# Patient Record
Sex: Male | Born: 1965 | Race: Black or African American | Hispanic: No | Marital: Married | Smoking: Current every day smoker
Health system: Southern US, Community
[De-identification: ages and names within clinical notes are randomized; demographics above are authoritative.]

## PROBLEM LIST (undated history)

## (undated) HISTORY — PX: HERNIA REPAIR: SHX51

## (undated) NOTE — *Deleted (*Deleted)
   11/09/2019  CC: No chief complaint on file.   HPI: AMAURIS DEBOIS is a 80 y.o. male who returns for a surveillance cystoscopy.         There were no vitals taken for this visit. NED. A&Ox3.   No respiratory distress   Abd soft, NT, ND Normal phallus with bilateral descended testicles  Cystoscopy Procedure Note  Patient identification was confirmed, informed consent was obtained, and patient was prepped using Betadine solution.  Lidocaine jelly was administered per urethral meatus.     Pre-Procedure: - Inspection reveals a normal caliber ureteral meatus.  Procedure: The flexible cystoscope was introduced without difficulty - No urethral strictures/lesions are present. - {Blank multiple:19197::"Enlarged","Surgically absent","Normal"} prostate *** - {Blank multiple:19197::"Normal","Elevated","Tight"} bladder neck - Bilateral ureteral orifices identified - Bladder mucosa  reveals no ulcers, tumors, or lesions - No bladder stones - No trabeculation  Retroflexion shows ***   Post-Procedure: - Patient tolerated the procedure well  Assessment/ Plan:  1. Gross/microscopis hematuria/urethral lesion    No follow-ups on file.  Georges Mouse, am acting as a scribe for Dr. Vanna Scotland.  {Add Production assistant, radio Statement}

---

## 2006-06-05 ENCOUNTER — Emergency Department: Payer: Self-pay | Admitting: Emergency Medicine

## 2007-06-29 ENCOUNTER — Emergency Department: Payer: Self-pay | Admitting: Emergency Medicine

## 2012-03-02 ENCOUNTER — Ambulatory Visit: Payer: Self-pay | Admitting: Emergency Medicine

## 2014-05-19 NOTE — Op Note (Signed)
PATIENT NAME:  Zachary AshingSLADE, Treydon D MR#:  [id number] DATE OF BIRTH:  [birth year]  DATE OF PROCEDURE:  03/02/2012  PREOPERATIVE DIAGNOSIS: Incarcerated ventral hernia.   POSTOPERATIVE DIAGNOSIS:  Incarcerated ventral hernia.  PROCEDURE: Repair of incarcerated ventral hernia.   DESCRIPTION OF PROCEDURE: This patient, who had an incarcerated ventral hernia, was then brought to surgery. Under general anesthesia, the abdomen was then prepped and draped. A small incision was made supraumbilically.  After cutting skin and subcutaneous tissue, the hernia sac was isolated from, and it was not coming from the umbilicus, it was coming from above the umbilicus. It was dissected out completely. It had an incarcerated piece of omentum in it. The omentum was then suture ligated with hemostats, and then with 0 Vicryl, and then pushed back into the belly. The hole in the abdomen was small, so I did not put any mesh in it; so, I put a few stitches with interrupted 0 Surgilon sutures. Subcuticular suturing was done with 3-0 Vicryl and 4-0 Vicryl. The patient tolerated the procedure well and was sent to the recovery room in satisfactory condition.   ____________________________ Alton RevereMasud S. Cecelia ByarsHashmi, MD msh:cb D: 03/02/2012 12:08:52 ET T: 03/02/2012 13:30:46 ET JOB#: [id number]  cc: Jerimie Mancuso S. Cecelia ByarsHashmi, MD, <Dictator> Margaretann LovelessNeelam S. Khan, MD Meryle ReadyMASUD S Dovie Kapusta MD ELECTRONICALLY SIGNED 03/04/2012 10:17

## 2019-08-24 ENCOUNTER — Emergency Department: Payer: No Typology Code available for payment source

## 2019-08-24 ENCOUNTER — Other Ambulatory Visit: Payer: Self-pay

## 2019-08-24 ENCOUNTER — Inpatient Hospital Stay
Admission: EM | Admit: 2019-08-24 | Discharge: 2019-08-28 | DRG: 728 | Disposition: A | Discharge status: Home / Self Care | Payer: No Typology Code available for payment source | Attending: Internal Medicine | Admitting: Internal Medicine

## 2019-08-24 DIAGNOSIS — N5089 Other specified disorders of the male genital organs: Secondary | ICD-10-CM | POA: Diagnosis not present

## 2019-08-24 DIAGNOSIS — N453 Epididymo-orchitis: Secondary | ICD-10-CM | POA: Diagnosis present

## 2019-08-24 DIAGNOSIS — F17210 Nicotine dependence, cigarettes, uncomplicated: Secondary | ICD-10-CM | POA: Diagnosis present

## 2019-08-24 DIAGNOSIS — B9620 Unspecified Escherichia coli [E. coli] as the cause of diseases classified elsewhere: Secondary | ICD-10-CM | POA: Diagnosis present

## 2019-08-24 DIAGNOSIS — N492 Inflammatory disorders of scrotum: Secondary | ICD-10-CM | POA: Diagnosis not present

## 2019-08-24 DIAGNOSIS — D649 Anemia, unspecified: Secondary | ICD-10-CM | POA: Diagnosis present

## 2019-08-24 DIAGNOSIS — D72829 Elevated white blood cell count, unspecified: Secondary | ICD-10-CM | POA: Diagnosis present

## 2019-08-24 DIAGNOSIS — Z720 Tobacco use: Secondary | ICD-10-CM | POA: Diagnosis not present

## 2019-08-24 DIAGNOSIS — Z20822 Contact with and (suspected) exposure to covid-19: Secondary | ICD-10-CM | POA: Diagnosis present

## 2019-08-24 DIAGNOSIS — N410 Acute prostatitis: Secondary | ICD-10-CM

## 2019-08-24 DIAGNOSIS — N3001 Acute cystitis with hematuria: Secondary | ICD-10-CM | POA: Diagnosis present

## 2019-08-24 DIAGNOSIS — N5082 Scrotal pain: Secondary | ICD-10-CM

## 2019-08-24 DIAGNOSIS — D638 Anemia in other chronic diseases classified elsewhere: Secondary | ICD-10-CM | POA: Diagnosis present

## 2019-08-24 LAB — BASIC METABOLIC PANEL
Anion gap: 11 (ref 5–15)
BUN: 19 mg/dL (ref 6–20)
CO2: 28 mmol/L (ref 22–32)
Calcium: 8.8 mg/dL — ABNORMAL LOW (ref 8.9–10.3)
Chloride: 96 mmol/L — ABNORMAL LOW (ref 98–111)
Creatinine, Ser: 1.21 mg/dL (ref 0.61–1.24)
GFR calc Af Amer: >60 mL/min (ref >60)
GFR calc non Af Amer: >60 mL/min (ref >60)
Glucose, Bld: 117 mg/dL — ABNORMAL HIGH (ref 70–99)
Potassium: 4.5 mmol/L (ref 3.5–5.1)
Sodium: 135 mmol/L (ref 135–145)

## 2019-08-24 LAB — CBC
HCT: 37.1 % — ABNORMAL LOW (ref 39.0–52.0)
HCT: 32.8 % — ABNORMAL LOW (ref 39.0–52.0)
Hemoglobin: 11.9 g/dL — ABNORMAL LOW (ref 13.0–17.0)
Hemoglobin: 10.6 g/dL — ABNORMAL LOW (ref 13.0–17.0)
MCH: 29.7 pg (ref 26.0–34.0)
MCH: 30.2 pg (ref 26.0–34.0)
MCHC: 32.1 g/dL (ref 30.0–36.0)
MCHC: 32.3 g/dL (ref 30.0–36.0)
MCV: 92.5 fL (ref 80.0–100.0)
MCV: 93.4 fL (ref 80.0–100.0)
Platelets: 365 10*3/uL (ref 150–400)
Platelets: 342 10*3/uL (ref 150–400)
RBC: 4.01 MIL/uL — ABNORMAL LOW (ref 4.22–5.81)
RBC: 3.51 MIL/uL — ABNORMAL LOW (ref 4.22–5.81)
RDW: 12.7 % (ref 11.5–15.5)
RDW: 12.8 % (ref 11.5–15.5)
WBC: 41.6 10*3/uL — ABNORMAL HIGH (ref 4.0–10.5)
WBC: 36.1 10*3/uL — ABNORMAL HIGH (ref 4.0–10.5)
nRBC: 0 % (ref 0.0–0.2)
nRBC: 0 % (ref 0.0–0.2)

## 2019-08-24 LAB — CREATININE, SERUM
Creatinine, Ser: 1.14 mg/dL (ref 0.61–1.24)
GFR calc Af Amer: >60 mL/min (ref >60)
GFR calc non Af Amer: >60 mL/min (ref >60)

## 2019-08-24 LAB — LACTIC ACID, PLASMA: Lactic Acid, Venous: 1.3 mmol/L (ref 0.5–1.9)

## 2019-08-24 LAB — URINALYSIS, COMPLETE (UACMP) WITH MICROSCOPIC
Bilirubin Urine: NEGATIVE
Glucose, UA: NEGATIVE mg/dL
Ketones, ur: NEGATIVE mg/dL
Nitrite: NEGATIVE
Protein, ur: 100 mg/dL — AB
RBC / HPF: >50 RBC/hpf — ABNORMAL HIGH (ref 0–5)
Specific Gravity, Urine: 1.019 (ref 1.005–1.030)
WBC, UA: >50 WBC/hpf — ABNORMAL HIGH (ref 0–5)
pH: 5 (ref 5.0–8.0)

## 2019-08-24 LAB — SARS CORONAVIRUS 2 BY RT PCR (HOSPITAL ORDER, PERFORMED IN ~~LOC~~ HOSPITAL LAB): SARS Coronavirus 2: NEGATIVE

## 2019-08-24 MED ORDER — ONDANSETRON HCL 4 MG PO TABS: 4.0000 mg | ORAL_TABLET | Freq: Four times a day (QID) | ORAL | Status: DC | PRN

## 2019-08-24 MED ORDER — VANCOMYCIN HCL 1500 MG/300ML IV SOLN
1500.0000 mg | Freq: Once | INTRAVENOUS | Status: AC
  Administered 2019-08-24: 1500 mg via INTRAVENOUS
  Filled 2019-08-24: qty 300

## 2019-08-24 MED ORDER — SODIUM CHLORIDE 0.9 % IV SOLN
1.0000 g | Freq: Three times a day (TID) | INTRAVENOUS | Status: DC
  Administered 2019-08-24 – 2019-08-26 (×5): 1 g via INTRAVENOUS
  Filled 2019-08-24 (×7): qty 1

## 2019-08-24 MED ORDER — VANCOMYCIN HCL 1250 MG/250ML IV SOLN
1250.0000 mg | Freq: Two times a day (BID) | INTRAVENOUS | Status: DC
  Administered 2019-08-25 – 2019-08-26 (×3): 1250 mg via INTRAVENOUS
  Filled 2019-08-24 (×6): qty 250

## 2019-08-24 MED ORDER — SODIUM CHLORIDE 0.9 % IV SOLN: INTRAVENOUS | Status: DC

## 2019-08-24 MED ORDER — ENOXAPARIN SODIUM 40 MG/0.4ML ~~LOC~~ SOLN
40.0000 mg | SUBCUTANEOUS | Status: DC
  Administered 2019-08-24 – 2019-08-25 (×2): 40 mg via SUBCUTANEOUS
  Filled 2019-08-24 (×4): qty 0.4

## 2019-08-24 MED ORDER — VANCOMYCIN HCL 500 MG/100ML IV SOLN
500.0000 mg | Freq: Once | INTRAVENOUS | Status: AC
  Administered 2019-08-24: 500 mg via INTRAVENOUS
  Filled 2019-08-24: qty 100

## 2019-08-24 MED ORDER — ONDANSETRON HCL 4 MG/2ML IJ SOLN: 4.0000 mg | Freq: Four times a day (QID) | INTRAMUSCULAR | Status: DC | PRN

## 2019-08-24 MED ORDER — LACTATED RINGERS IV BOLUS
1000.0000 mL | Freq: Once | INTRAVENOUS | Status: AC
  Administered 2019-08-24: 1000 mL via INTRAVENOUS

## 2019-08-24 MED ORDER — MORPHINE SULFATE (PF) 2 MG/ML IV SOLN: 2.0000 mg | INTRAVENOUS | Status: DC | PRN

## 2019-08-24 MED ORDER — METRONIDAZOLE IN NACL 5-0.79 MG/ML-% IV SOLN
500.0000 mg | Freq: Once | INTRAVENOUS | Status: AC
  Administered 2019-08-24: 500 mg via INTRAVENOUS
  Filled 2019-08-24: qty 100

## 2019-08-24 MED ORDER — SODIUM CHLORIDE 0.9 % IV SOLN
2.0000 g | Freq: Once | INTRAVENOUS | Status: AC
  Administered 2019-08-24: 2 g via INTRAVENOUS
  Filled 2019-08-24: qty 2

## 2019-08-24 MED ORDER — IOHEXOL 300 MG/ML  SOLN
100.0000 mL | Freq: Once | INTRAMUSCULAR | Status: AC | PRN
  Administered 2019-08-24: 100 mL via INTRAVENOUS

## 2019-08-24 NOTE — ED Notes (Signed)
This RN messaged pharmacy for med verification.  

## 2019-08-24 NOTE — ED Notes (Signed)
This [facility] pharmacy requesting meds.

## 2019-08-24 NOTE — Progress Notes (Signed)
Pharmacy Antibiotic Note  Zachary Joseph is a 54 y.o. male admitted on 08/24/2019 with sepsis s/t scrotal cellulitis.  Pharmacy has been consulted for vancomycin dosing. Patient received vanc 2g IV load in ED.  Plan: Will continue vancomycin 1.25g IV q12h and continue to monitor renal function and make adjustments based on changes in renal function.   Ke 0.0652 T1/2 ~ 12 hrs  Height: 5\' 9"  (175.3 cm) Weight: 81.6 kg (180 lb) IBW/kg (Calculated) : 70.7  Temp (24hrs), Avg:98.9 F (37.2 C), Min:98.4 F (36.9 C), Max:99.8 F (37.7 C)  Recent Labs  Lab 08/24/19 1242 08/24/19 2010 08/24/19 2204  WBC 41.6*  --  36.1*  CREATININE 1.21  --  1.14  LATICACIDVEN  --  1.3  --     Estimated Creatinine Clearance: 73.2 mL/min (by C-G formula based on SCr of 1.14 mg/dL).    No Known Allergies  Thank you for allowing pharmacy to be a part of this patient's care.  [id number], PharmD, BCPS Clinical Pharmacist 08/24/2019 11:16 PM

## 2019-08-24 NOTE — ED Triage Notes (Signed)
Pt c/o having painful urination for a day and then started having BL testicle swelling with pain over the past 2 days with fever and sweats. Denies any redness of the skin. Denies any painful urination at present.

## 2019-08-24 NOTE — ED Notes (Signed)
This RN called lab to place an add-on for a urine culture.

## 2019-08-24 NOTE — ED Notes (Signed)
Pt to Ct

## 2019-08-24 NOTE — Consult Note (Signed)
Urology Consult  I have been asked to see the patient by Dr.Smith , for evaluation and management of scrotal swelling.  Chief Complaint: scrotal pain  History of Present Illness: Zachary Joseph is a 54 y.o. year old no previous medical history (does not see MDs) who presents to the emergency room with 2 days of progressive scrotal swelling and pain.  He notes that about a week ago, he began having gross hematuria and some urinary symptoms including mild dysuria and sweats.  2 days ago, he began having increasing scrotal pain and swelling.  He got to the point where he is having difficulty ambulating due to the tenderness.  He also mentions that he is passed some debris about a week ago which he felt may have been a stone.  He denies any flank pain.  Subjective fevers only.  No personal history of UTIs, urinary symptoms, or scrotal infections.  As far as he is aware, he is not a diabetic.  History reviewed. No pertinent past medical history.  Past Surgical History:  Procedure Laterality Date  . HERNIA REPAIR      Home Medications:  Takes no meds  Allergies: No Known Allergies  No family history on file.  Social History:  reports that he has been smoking cigarettes. He has never used smokeless tobacco. He reports previous alcohol use. He reports previous drug use.  ROS: A complete review of systems was performed.  All systems are negative except for pertinent findings as noted.  Physical Exam:  Vital signs in last 24 hours: Temp:  [98.5 F (36.9 C)-99.8 F (37.7 C)] 99.8 F (37.7 C) (07/28 2034) Pulse Rate:  [91] 91 (07/28 2034) Resp:  [17-18] 18 (07/28 2034) BP: (111-140)/(55-78) 140/78 (07/28 2034) SpO2:  [93 %-94 %] 93 % (07/28 2034) Weight:  [81.6 kg] 81.6 kg (07/28 1237) Constitutional:  Alert and oriented, No acute distress.  Pleasant.  Wife at bedside. HEENT: [place] AT, moist mucus membranes.  Trachea midline, no masses Cardiovascular: Regular rate and rhythm, no  clubbing, cyanosis, or edema. Respiratory: Normal respiratory effort, lungs clear bilaterally GI: Abdomen is soft, nontender, nondistended, no abdominal masses GU: Uncircumcised phallus with easily retractable foreskin, no penile discharge.  Bilateral descended testicles with slightly erythematous but nonindurated nonedematous skin.  Bilateral testicles are diffusely enlarged, right greater than left, firm, and tender consistent with severe bilateral epididymoorchitis.  Perineum is intact.  No crepitus, fluctuance, or any other physical exam findings consistent or concerning for Fournier's gangrene. Skin: No rashes, bruises or suspicious lesions Neurologic: Grossly intact, no focal deficits, moving all 4 extremities Psychiatric: Normal mood and affect   Laboratory Data:  Recent Labs    08/24/19 1242  WBC 41.6*  HGB 11.9*  HCT 37.1*   Recent Labs    08/24/19 1242  NA 135  K 4.5  CL 96*  CO2 28  GLUCOSE 117*  BUN 19  CREATININE 1.21  CALCIUM 8.8*   Urinalysis reviewed, grossly positive.  Radiologic Imaging: CLINICAL DATA:  Bilateral testicular pain and swelling and painful urination  EXAM: CT ABDOMEN AND PELVIS WITH CONTRAST  TECHNIQUE: Multidetector CT imaging of the abdomen and pelvis was performed using the standard protocol following bolus administration of intravenous contrast.  CONTRAST:  OMNIPAQUE IOHEXOL 300 MG/ML  SOLN  COMPARISON:  None.  FINDINGS: Lower chest: The visualized heart size within normal limits. No pericardial fluid/thickening.  No hiatal hernia.  The visualized portions of the lungs are clear.  Hepatobiliary:  The liver is normal in density without focal abnormality.The main portal vein is patent. No evidence of calcified gallstones, gallbladder wall thickening or biliary dilatation.  Pancreas: Unremarkable. No pancreatic ductal dilatation or surrounding inflammatory changes.  Spleen: Normal in size without focal  abnormality.  Adrenals/Urinary Tract: Both adrenal glands appear normal. Tiny bilateral hypodense lesions are seen within both kidneys, likely statistically renal cysts. No renal or collecting system calculi. The bladder is partially decompressed with apparent mild diffuse wall thickening and hyperenhancement.  Stomach/Bowel: The stomach, small bowel, and colon are normal in appearance. Scattered colonic diverticula are noted. No inflammatory changes, wall thickening, or obstructive findings.The appendix is normal.  Vascular/Lymphatic: There are no enlarged mesenteric, retroperitoneal, or pelvic lymph nodes. Scattered calcified and noncalcified plaque is noted.  Reproductive: The prostate is unremarkable.  Other: There is diffuse subcutaneous edema seen surrounding the bilateral testicles with small bilateral hydroceles. There is mild edema and wall thickening noted within the the perineal soft tissues. No subcutaneous emphysema is seen.  Musculoskeletal: No acute or significant osseous findings.  IMPRESSION: Findings which could be suggestive of diffuse cellulitis involving the bilateral scrotum with small bilateral hydroceles. No evidence of subcutaneous emphysema.  Partially decompressed bladder with findings that could be suggestive mild cystitis.  Aortic Atherosclerosis (ICD10-I70.0).   Electronically Signed   By: Jonna Clark M.D.   On: 08/24/2019 21:05  CT scan was personally reviewed.  Agree with radiologic interpretation.  There is no evidence of Fournier's gangrene on this study.  No drainable fluid collections.  Impression/Assessment:  54 year old male with acute cystitis with hematuria complicated by severe bilateral epididymoorchitis.  At this point time, there is no concern for Fournier's gangrene.  Significant leukocytosis but otherwise hemodynamically stable without other signs of systemic disease.  Plan:  -Given initial concern for more  aggressive disease, Urology will continue to follow this patient closely.  Like him to be n.p.o. at midnight for early morning evaluation.  If his exam is stable or improving, will advance diet. -Recommend broad-spectrum antibiotics including anaerobic coverage for the time being, just based on culture and sensitivity data -Agree with admission to medicine, urology will follow along  08/24/2019, 9:04 PM  Vanna Scotland,  MD

## 2019-08-24 NOTE — ED Notes (Signed)
Pt provided with urinal at this time

## 2019-08-24 NOTE — ED Provider Notes (Signed)
[facility] Emergency Department Provider Note ____________________________________________   First MD Initiated Contact with Patient 08/24/19 1958     (approximate)  I have reviewed the triage vital signs and the nursing notes.  HISTORY  Chief Complaint Groin Swelling   HPI Zachary Joseph is a 54 y.o. male presents to the ED for evaluation of dysuria and testicular swelling.    Patient reports a 40-pack-year smoking history, otherwise denies any medical history.  Denies taking prescription medications, or previously being prescribed medications.  Reports not liking hospitals or physicians. Report surgical history of previous umbilical hernia repair.  Patient reports 3-4 days of dysuria at home, and noticing rapidly progressive groin swelling when he woke up this morning.  Patient denies any fevers but his wife, who is present in the room, reports patient has been having night sweats and this is similar timeframe.  Patient reports difficulty walking due to his testicular pain, citing 9/10 intensity pain with ambulation.  Reports the pain eases up when he sits down, 1-4/10 intensity pain.  Patient has not had recent antibiotics, and denies urologic surgery in the past. Patient denies diarrhea or acute stool changes.  Denies hematuria or trauma.  History reviewed. No pertinent past medical history.  There are no problems to display for this patient.   Past Surgical History:  Procedure Laterality Date  . HERNIA REPAIR      Prior to Admission medications   Not on File    Allergies Patient has no known allergies.  No family history on file.  Social History Social History   Tobacco Use  . Smoking status: Current Every Day Smoker    Types: Cigarettes  . Smokeless tobacco: Never Used  Substance Use Topics  . Alcohol use: Not Currently  . Drug use: Not Currently    Review of Systems  Constitutional: No fever/chills Eyes: No visual  changes. ENT: No sore throat. Cardiovascular: Denies chest pain. Respiratory: Denies shortness of breath. Gastrointestinal: No abdominal pain.  No nausea, no vomiting.  No diarrhea.  No constipation. Genitourinary: Positive for dysuria and scrotal swelling. Musculoskeletal: Negative for back pain. Skin: Negative for rash. Neurological: Negative for headaches, focal weakness or numbness.  ____________________________________________   PHYSICAL EXAM:  VITAL SIGNS: ED Triage Vitals  Enc Vitals Group     BP 08/24/19 1237 (!) 111/55     Pulse Rate 08/24/19 1237 91     Resp 08/24/19 1237 17     Temp 08/24/19 1242 98.5 F (36.9 C)     Temp Source 08/24/19 1237 Oral     SpO2 08/24/19 1237 94 %     Weight 08/24/19 1237 180 lb (81.6 kg)     Height 08/24/19 1237 5\' 9"  (1.753 m)     Head Circumference --      Peak Flow --      Pain Score 08/24/19 1237 10     Pain Loc --      Pain Edu? --      Excl. in GC? --      Constitutional: Alert and oriented.  Supine in bed, clearly uncomfortable but without acute distress.  Conversational in full sentences. Eyes: Conjunctivae are normal. PERRL. EOMI. Head: Atraumatic. Nose: No congestion/rhinnorhea. Mouth/Throat: Mucous membranes are moist.  Oropharynx non-erythematous. Neck: No stridor. No cervical spine tenderness to palpation. Cardiovascular: Normal rate, regular rhythm. Grossly normal heart sounds.  Good peripheral circulation. Respiratory: Normal respiratory effort.  No retractions. Lungs CTAB. Gastrointestinal: Soft , nondistended, nontender to  palpation. No abdominal bruits. No CVA tenderness. GU: Uncircumcised penis without swelling, lesions or tenderness to palpation.  No urethral discharge. Obviously and diffusely swollen scrotum L > R sides.  Left greater than right testicular tenderness to palpation.  Bilateral testicles appear diffusely enlarged without nodularity.  No palpable additional masses to suggest hernia.  When I lift  his scrotum to visualize the posterior aspect, he exclaims in pain. Musculoskeletal: No lower extremity tenderness nor edema.  No joint effusions. No signs of acute trauma. Neurologic:  Normal speech and language. No gross focal neurologic deficits are appreciated. No gait instability noted. Skin:  Skin is warm, dry and intact. No rash noted. Psychiatric: Mood and affect are normal. Speech and behavior are normal.  ____________________________________________   LABS (all labs ordered are listed, but only abnormal results are displayed)  Labs Reviewed  URINALYSIS, COMPLETE (UACMP) WITH MICROSCOPIC - Abnormal; Notable for the following components:      Result Value   Color, Urine AMBER (*)    APPearance CLOUDY (*)    Hgb urine dipstick LARGE (*)    Protein, ur 100 (*)    Leukocytes,Ua LARGE (*)    RBC / HPF >50 (*)    WBC, UA >50 (*)    Bacteria, UA FEW (*)    All other components within normal limits  BASIC METABOLIC PANEL - Abnormal; Notable for the following components:   Chloride 96 (*)    Glucose, Bld 117 (*)    Calcium 8.8 (*)    All other components within normal limits  CBC - Abnormal; Notable for the following components:   WBC 41.6 (*)    RBC 4.01 (*)    Hemoglobin 11.9 (*)    HCT 37.1 (*)    All other components within normal limits  CULTURE, BLOOD (ROUTINE X 2)  CULTURE, BLOOD (ROUTINE X 2)  URINE CULTURE  SARS CORONAVIRUS 2 BY RT PCR (HOSPITAL ORDER, PERFORMED IN [facility])  LACTIC ACID, PLASMA   ____________________________________________  12 Lead EKG   ____________________________________________  RADIOLOGY  ED MD interpretation:    Official radiology report(s): CT ABDOMEN PELVIS W CONTRAST  Result Date: 08/24/2019 CLINICAL DATA:  Bilateral testicular pain and swelling and painful urination EXAM: CT ABDOMEN AND PELVIS WITH CONTRAST TECHNIQUE: Multidetector CT imaging of the abdomen and pelvis was performed using the standard protocol  following bolus administration of intravenous contrast. CONTRAST:  OMNIPAQUE IOHEXOL 300 MG/ML  SOLN COMPARISON:  None. FINDINGS: Lower chest: The visualized heart size within normal limits. No pericardial fluid/thickening. No hiatal hernia. The visualized portions of the lungs are clear. Hepatobiliary: The liver is normal in density without focal abnormality.The main portal vein is patent. No evidence of calcified gallstones, gallbladder wall thickening or biliary dilatation. Pancreas: Unremarkable. No pancreatic ductal dilatation or surrounding inflammatory changes. Spleen: Normal in size without focal abnormality. Adrenals/Urinary Tract: Both adrenal glands appear normal. Tiny bilateral hypodense lesions are seen within both kidneys, likely statistically renal cysts. No renal or collecting system calculi. The bladder is partially decompressed with apparent mild diffuse wall thickening and hyperenhancement. Stomach/Bowel: The stomach, small bowel, and colon are normal in appearance. Scattered colonic diverticula are noted. No inflammatory changes, wall thickening, or obstructive findings.The appendix is normal. Vascular/Lymphatic: There are no enlarged mesenteric, retroperitoneal, or pelvic lymph nodes. Scattered calcified and noncalcified plaque is noted. Reproductive: The prostate is unremarkable. Other: There is diffuse subcutaneous edema seen surrounding the bilateral testicles with small bilateral hydroceles. There is mild edema  and wall thickening noted within the the perineal soft tissues. No subcutaneous emphysema is seen. Musculoskeletal: No acute or significant osseous findings. IMPRESSION: Findings which could be suggestive of diffuse cellulitis involving the bilateral scrotum with small bilateral hydroceles. No evidence of subcutaneous emphysema. Partially decompressed bladder with findings that could be suggestive mild cystitis. Aortic Atherosclerosis (ICD10-I70.0). Electronically Signed   By:  Jonna Clark M.D.   On: 08/24/2019 21:05    ____________________________________________   PROCEDURES and INTERVENTIONS  Procedure(s) performed (including Critical Care):  Procedures  Medications  vancomycin (VANCOREADY) IVPB 1500 mg/300 mL (has no administration in time range)  ceFEPIme (MAXIPIME) 2 g in sodium chloride 0.9 % 100 mL IVPB (2 g Intravenous New Bag/Given 08/24/19 2056)  metroNIDAZOLE (FLAGYL) IVPB 500 mg (has no administration in time range)  lactated ringers bolus 1,000 mL (1,000 mLs Intravenous New Bag/Given 08/24/19 2033)  iohexol (OMNIPAQUE) 300 MG/ML solution 100 mL (100 mLs Intravenous Contrast Given 08/24/19 2045)    ____________________________________________   INITIAL IMPRESSION / ASSESSMENT AND PLAN / ED COURSE  54 year old man with a smoking history presenting with scrotal pain and swelling concerning for scrotal cellulitis and possible prostatitis, requiring medical admission for IV antibiotics.  Hemodynamically stable with normal vital signs on room air.  Exam with diffusely swollen and tender scrotum without evidence of necrosis or penile lesions.  He has a benign abdomen, otherwise is without evidence of acute pathology.  Blood work with leukocytosis of 41, but no lactic acidosis.  Initial concern for Fournier's gangrene, and so consulted urology requests CT imaging, which does not show subcutaneous gas or evidence of gangrene at this time.  Provided broad-spectrum coverage of IV antibiotics, and will admit the patient to hospitalist medicine for further work-up and management.  Clinical Course as of Aug 24 2111  Wed Aug 24, 2019  2026 Called Urologist on call, Dr. Vanna Scotland. Straight to voicemail, left message   [DS]  2032 Callback from Dr. Apolinar Junes, who recommends CT imaging and return call to her.   [DS]  2055 Message back from Dr. Apolinar Junes on a secure chat function of epic, she has reviewed patient CT scan and recommends hospitalist admission for IV  antibiotics.  N.p.o. at midnight for possible procedure tomorrow if his clinical status does not improve.  Recommend additional coverage for anaerobes, so I added Flagyl. Unfortunately, it appears as though the CT scan was not extended inferiorly enough to include the inferior portion of his scrotum, and is therefore not a complete scan.   [DS]    Clinical Course User Index [DS] Delton Prairie, MD    ____________________________________________   FINAL CLINICAL IMPRESSION(S) / ED DIAGNOSES  Final diagnoses:  Cellulitis of scrotum  Scrotal pain  Acute prostatitis     ED Discharge Orders    None       Lamere Lightner Katrinka Blazing   Note:  This document was prepared using Dragon voice recognition software and may include unintentional dictation errors.   Delton Prairie, MD 08/24/19 2113

## 2019-08-24 NOTE — H&P (Signed)
History and Physical   Zachary Joseph WUJ:[id number] DOB: 1965/05/26 DOA: 08/24/2019  Referring MD/NP/PA: Dr. Katrinka BlazingSmith  PCP: Patient, No Pcp Per   Outpatient Specialists: None  Patient coming from: Home  Chief Complaint: Scrotal swelling   HPI: Zachary Joseph is a 54 y.o. male with medical history significant of no significant past medical history who also has not been following up with a position on a regular basis last physician visit more than 2 years ago married and sexually active with one partner who started having scrotal swelling about 2 days ago and since then this morning it has turned warm red and more painful.  Pain is a 10 out of 10.  More on the left scrotum than the right.  Associated with some mild dysuria.  No fever or chills.  Patient has no diarrhea no nausea vomiting.  He was seen in the ER and diagnosed with possible scrotal cellulitis.  Urology consulted with recommendation for admission and IV antibiotics.  Patient to be seen by urology and to be kept n.p.o. after midnight.  He still complaining of pain otherwise no other complaints.  Patient said he was unable to walk at home due to the swelling.  Any slight movement brings in the pain.  His scrotum is overall inflamed but more so the testicles.  He has been admitted for further work-up and treatment.  ED Course: Temperature 99.8, blood pressure 140/78 pulse 91 respiratory rate of 18 oxygen sat 93% on room air.  White count is 36.1 thousand hemoglobin 10.6 and platelet 342.  Chemistry largely within normal.  COVID-19 negative.  CT abdomen pelvis shows possible diffuse cellulitis involving the bilateral scrotum with small bilateral hydroceles no evidence of subcutaneous emphysema.  Probably also mild cystitis.  Patient initiated on IV antibiotics and being admitted for further work-up.  Review of Systems: As per HPI otherwise 10 point review of systems negative.    History reviewed. No pertinent past medical history.  Past  Surgical History:  Procedure Laterality Date   HERNIA REPAIR       reports that he has been smoking cigarettes. He has never used smokeless tobacco. He reports previous alcohol use. He reports previous drug use.  No Known Allergies  No family history on file.   Prior to Admission medications   Not on File    Physical Exam: Vitals:   08/24/19 1237 08/24/19 1242 08/24/19 2034 08/24/19 2134  BP: (!) 111/55  (!) 140/78   Pulse: 91  91   Resp: 17  18   Temp:  98.5 F (36.9 C) 99.8 F (37.7 C) 98.4 F (36.9 C)  TempSrc: Oral  Oral Oral  SpO2: 94%  93%   Weight: 81.6 kg     Height: 5\' 9"  (1.753 m)         Constitutional: Acutely ill looking no acute distress Vitals:   08/24/19 1237 08/24/19 1242 08/24/19 2034 08/24/19 2134  BP: (!) 111/55  (!) 140/78   Pulse: 91  91   Resp: 17  18   Temp:  98.5 F (36.9 C) 99.8 F (37.7 C) 98.4 F (36.9 C)  TempSrc: Oral  Oral Oral  SpO2: 94%  93%   Weight: 81.6 kg     Height: 5\' 9"  (1.753 m)      Eyes: PERRL, lids and conjunctivae normal ENMT: Mucous membranes are dry. Posterior pharynx clear of any exudate or lesions.Normal dentition.  Neck: normal, supple, no masses, no thyromegaly Respiratory: clear to auscultation  bilaterally, no wheezing, no crackles. Normal respiratory effort. No accessory muscle use.  Cardiovascular: Regular rate and rhythm, no murmurs / rubs / gallops. No extremity edema. 2+ pedal pulses. No carotid bruits.  Abdomen: no tenderness, no masses palpated. No hepatosplenomegaly. Bowel sounds positive.  Musculoskeletal: no clubbing / cyanosis. No joint deformity upper and lower extremities. Good ROM, no contractures. Normal muscle tone.  Skin: no rashes, lesions, ulcers. No induration GU: Patient's scrotum is swollen, tender, testicles being especially on the left, warm to touch surrounding erythema Neurologic: CN 2-12 grossly intact. Sensation intact, DTR normal. Strength 5/5 in all 4.  Psychiatric: Normal  judgment and insight. Alert and oriented x 3. Normal mood.     Labs on Admission: I have personally reviewed following labs and imaging studies  CBC: Recent Labs  Lab 08/24/19 1242 08/24/19 2204  WBC 41.6* 36.1*  HGB 11.9* 10.6*  HCT 37.1* 32.8*  MCV 92.5 93.4  PLT 365 342   Basic Metabolic Panel: Recent Labs  Lab 08/24/19 1242 08/24/19 2204  NA 135  --   K 4.5  --   CL 96*  --   CO2 28  --   GLUCOSE 117*  --   BUN 19  --   CREATININE 1.21 1.14  CALCIUM 8.8*  --    GFR: Estimated Creatinine Clearance: 73.2 mL/min (by C-G formula based on SCr of 1.14 mg/dL). Liver Function Tests: No results for input(s): AST, ALT, ALKPHOS, BILITOT, PROT, ALBUMIN in the last 168 hours. No results for input(s): LIPASE, AMYLASE in the last 168 hours. No results for input(s): AMMONIA in the last 168 hours. Coagulation Profile: No results for input(s): INR, PROTIME in the last 168 hours. Cardiac Enzymes: No results for input(s): CKTOTAL, CKMB, CKMBINDEX, TROPONINI in the last 168 hours. BNP (last 3 results) No results for input(s): PROBNP in the last 8760 hours. HbA1C: No results for input(s): HGBA1C in the last 72 hours. CBG: No results for input(s): GLUCAP in the last 168 hours. Lipid Profile: No results for input(s): CHOL, HDL, LDLCALC, TRIG, CHOLHDL, LDLDIRECT in the last 72 hours. Thyroid Function Tests: No results for input(s): TSH, T4TOTAL, FREET4, T3FREE, THYROIDAB in the last 72 hours. Anemia Panel: No results for input(s): VITAMINB12, FOLATE, FERRITIN, TIBC, IRON, RETICCTPCT in the last 72 hours. Urine analysis:    Component Value Date/Time   COLORURINE AMBER (A) 08/24/2019 1242   APPEARANCEUR CLOUDY (A) 08/24/2019 1242   LABSPEC 1.019 08/24/2019 1242   PHURINE 5.0 08/24/2019 1242   GLUCOSEU NEGATIVE 08/24/2019 1242   HGBUR LARGE (A) 08/24/2019 1242   BILIRUBINUR NEGATIVE 08/24/2019 1242   KETONESUR NEGATIVE 08/24/2019 1242   PROTEINUR 100 (A) 08/24/2019 1242    NITRITE NEGATIVE 08/24/2019 1242   LEUKOCYTESUR LARGE (A) 08/24/2019 1242   Sepsis Labs: @LABRCNTIP (procalcitonin:4,lacticidven:4) ) Recent Results (from the past 240 hour(s))  SARS Coronavirus 2 by RT PCR (hospital order, performed in [facility] hospital lab) Nasopharyngeal Nasopharyngeal Swab     Status: None   Collection Time: 08/24/19  9:22 PM   Specimen: Nasopharyngeal Swab  Result Value Ref Range Status   SARS Coronavirus 2 NEGATIVE NEGATIVE Final    Comment: (NOTE) SARS-CoV-2 target nucleic acids are NOT DETECTED.  The SARS-CoV-2 RNA is generally detectable in upper and lower respiratory specimens during the acute phase of infection. The lowest concentration of SARS-CoV-2 viral copies this assay can detect is 250 copies / mL. A negative result does not preclude SARS-CoV-2 infection and should not be used as the  sole basis for treatment or other patient management decisions.  A negative result may occur with improper specimen collection / handling, submission of specimen other than nasopharyngeal swab, presence of viral mutation(s) within the areas targeted by this assay, and inadequate number of viral copies (<250 copies / mL). A negative result must be combined with clinical observations, patient history, and epidemiological information.  Fact Sheet for Patients:   BoilerBrush.com.cy  Fact Sheet for Healthcare Providers: https://pope.com/  This test is not yet approved or  cleared by the [place] FDA and has been authorized for detection and/or diagnosis of SARS-CoV-2 by FDA under an Emergency Use Authorization (EUA).  This EUA will remain in effect (meaning this test can be used) for the duration of the COVID-19 declaration under Section 564(b)(1) of the Act, 21 U.S.C. section 360bbb-3(b)(1), unless the authorization is terminated or revoked sooner.  Performed at [facility], [address]  [place], [place] [address]      Radiological Exams on Admission: CT ABDOMEN PELVIS W CONTRAST  Result Date: 08/24/2019 CLINICAL DATA:  Bilateral testicular pain and swelling and painful urination EXAM: CT ABDOMEN AND PELVIS WITH CONTRAST TECHNIQUE: Multidetector CT imaging of the abdomen and pelvis was performed using the standard protocol following bolus administration of intravenous contrast. CONTRAST:  OMNIPAQUE IOHEXOL 300 MG/ML  SOLN COMPARISON:  None. FINDINGS: Lower chest: The visualized heart size within normal limits. No pericardial fluid/thickening. No hiatal hernia. The visualized portions of the lungs are clear. Hepatobiliary: The liver is normal in density without focal abnormality.The main portal vein is patent. No evidence of calcified gallstones, gallbladder wall thickening or biliary dilatation. Pancreas: Unremarkable. No pancreatic ductal dilatation or surrounding inflammatory changes. Spleen: Normal in size without focal abnormality. Adrenals/Urinary Tract: Both adrenal glands appear normal. Tiny bilateral hypodense lesions are seen within both kidneys, likely statistically renal cysts. No renal or collecting system calculi. The bladder is partially decompressed with apparent mild diffuse wall thickening and hyperenhancement. Stomach/Bowel: The stomach, small bowel, and colon are normal in appearance. Scattered colonic diverticula are noted. No inflammatory changes, wall thickening, or obstructive findings.The appendix is normal. Vascular/Lymphatic: There are no enlarged mesenteric, retroperitoneal, or pelvic lymph nodes. Scattered calcified and noncalcified plaque is noted. Reproductive: The prostate is unremarkable. Other: There is diffuse subcutaneous edema seen surrounding the bilateral testicles with small bilateral hydroceles. There is mild edema and wall thickening noted within the the perineal soft tissues. No subcutaneous emphysema is seen. Musculoskeletal: No acute or significant  osseous findings. IMPRESSION: Findings which could be suggestive of diffuse cellulitis involving the bilateral scrotum with small bilateral hydroceles. No evidence of subcutaneous emphysema. Partially decompressed bladder with findings that could be suggestive mild cystitis. Aortic Atherosclerosis (ICD10-I70.0). Electronically Signed   By: Jonna Clark M.D.   On: 08/24/2019 21:05      Assessment/Plan Principal Problem:   Cellulitis of scrotum Active Problems:   Orchitis and epididymitis     #1 suspected cellulitis of the scrotum: Patient most likely also has orchitis involving the left scrotum.  Also suspected hydrocele.  Patient will be admitted to the medical floor.  Elevate the scrotum.  Pain control.  IV antibiotics to cover for gram positives, gram negatives and anaerobes.  Urology already consulted.  We will follow recommendations.  We will check scrotal ultrasound to further characterize lesion.  Urine culture has been sent and will follow.  #2 mild cystitis: Again await urine culture sensitivities.  Empirically on antibiotics.  Will follow  #3 leukocytosis: Significant  leukocytosis most likely from his inflammation.  White count of 36,000.  Follow closely.   #4 normocytic anemia: No prior physician follow-up.  No colonoscopies.  Check stool guaiacs but patient likely needs to have colonoscopy scheduled at discharge.  Tobacco abuse: Tobacco cessation counseling with nicotine patch to be offered   DVT prophylaxis: Lovenox Code Status: Full code Family Communication: Wife at bedside Disposition Plan: Home Consults called: Urology Dr. Apolinar Junes Admission status: Inpatient  Severity of Illness: The appropriate patient status for this patient is INPATIENT. Inpatient status is judged to be reasonable and necessary in order to provide the required intensity of service to ensure the patient's safety. The patient's presenting symptoms, physical exam findings, and initial radiographic and  laboratory data in the context of their chronic comorbidities is felt to place them at high risk for further clinical deterioration. Furthermore, it is not anticipated that the patient will be medically stable for discharge from the hospital within 2 midnights of admission. The following factors support the patient status of inpatient.   " The patient's presenting symptoms include scrotal swelling. " The worrisome physical exam findings include tender swollen scrotum. " The initial radiographic and laboratory data are worrisome because of possible cellulitis with cystitis on CT. " The chronic co-morbidities include none.   * I certify that at the point of admission it is my clinical judgment that the patient will require inpatient hospital care spanning beyond 2 midnights from the point of admission due to high intensity of service, high risk for further deterioration and high frequency of surveillance required.Lonia Blood MD [facility] Pager [phone]  If 7PM-7AM, please contact night-coverage [email or web address] Password [facility]  08/24/2019, 11:22 PM

## 2019-08-24 NOTE — ED Notes (Signed)
ED Provider Smith at bedside. 

## 2019-08-24 NOTE — ED Notes (Signed)
MD Garba at bedside at this time.

## 2019-08-25 ENCOUNTER — Inpatient Hospital Stay: Payer: No Typology Code available for payment source

## 2019-08-25 DIAGNOSIS — N453 Epididymo-orchitis: Secondary | ICD-10-CM

## 2019-08-25 DIAGNOSIS — D72829 Elevated white blood cell count, unspecified: Secondary | ICD-10-CM

## 2019-08-25 DIAGNOSIS — Z720 Tobacco use: Secondary | ICD-10-CM

## 2019-08-25 LAB — COMPREHENSIVE METABOLIC PANEL
ALT: 28 U/L (ref 0–44)
AST: 36 U/L (ref 15–41)
Albumin: 2.6 g/dL — ABNORMAL LOW (ref 3.5–5.0)
Alkaline Phosphatase: 78 U/L (ref 38–126)
Anion gap: 10 (ref 5–15)
BUN: 20 mg/dL (ref 6–20)
CO2: 30 mmol/L (ref 22–32)
Calcium: 8 mg/dL — ABNORMAL LOW (ref 8.9–10.3)
Chloride: 100 mmol/L (ref 98–111)
Creatinine, Ser: 0.95 mg/dL (ref 0.61–1.24)
GFR calc Af Amer: >60 mL/min (ref >60)
GFR calc non Af Amer: >60 mL/min (ref >60)
Glucose, Bld: 119 mg/dL — ABNORMAL HIGH (ref 70–99)
Potassium: 4.2 mmol/L (ref 3.5–5.1)
Sodium: 140 mmol/L (ref 135–145)
Total Bilirubin: 0.8 mg/dL (ref 0.3–1.2)
Total Protein: 6.2 g/dL — ABNORMAL LOW (ref 6.5–8.1)

## 2019-08-25 LAB — CBC
HCT: 31.6 % — ABNORMAL LOW (ref 39.0–52.0)
Hemoglobin: 10.5 g/dL — ABNORMAL LOW (ref 13.0–17.0)
MCH: 30.1 pg (ref 26.0–34.0)
MCHC: 33.2 g/dL (ref 30.0–36.0)
MCV: 90.5 fL (ref 80.0–100.0)
Platelets: 347 10*3/uL (ref 150–400)
RBC: 3.49 MIL/uL — ABNORMAL LOW (ref 4.22–5.81)
RDW: 12.8 % (ref 11.5–15.5)
WBC: 28.5 10*3/uL — ABNORMAL HIGH (ref 4.0–10.5)
nRBC: 0 % (ref 0.0–0.2)

## 2019-08-25 LAB — HIV ANTIBODY (ROUTINE TESTING W REFLEX): HIV Screen 4th Generation wRfx: NONREACTIVE

## 2019-08-25 MED ORDER — ACETAMINOPHEN 500 MG PO TABS
1000.0000 mg | ORAL_TABLET | Freq: Four times a day (QID) | ORAL | Status: DC | PRN
  Administered 2019-08-25 – 2019-08-26 (×2): 1000 mg via ORAL
  Filled 2019-08-25 (×2): qty 2

## 2019-08-25 MED ORDER — SODIUM CHLORIDE 0.9 % IV SOLN
INTRAVENOUS | Status: DC | PRN
  Administered 2019-08-25 – 2019-08-27 (×2): 250 mL via INTRAVENOUS

## 2019-08-25 MED ORDER — SODIUM CHLORIDE 0.9 % IV SOLN: INTRAVENOUS | Status: AC

## 2019-08-25 NOTE — Progress Notes (Addendum)
Urology Inpatient Progress Note  Subjective: No acute events overnight Afebrile, VSS WBC count down today, 28.5.  Creatinine down, 0.95. Urine culture pending, blood cultures pending with no growth at <12 hours.  On antibiotics as below. He reports feeling slightly better today.  He believes his scrotal erythema and tenderness is improved.  Anti-infectives: Anti-infectives (From admission, onward)   Start     Dose/Rate Route Frequency Ordered Stop   08/25/19 0930  vancomycin (VANCOREADY) IVPB 1250 mg/250 mL     Discontinue     1,250 mg 166.7 mL/hr over 90 Minutes Intravenous Every 12 hours 08/24/19 2301     08/24/19 2300  vancomycin (VANCOREADY) IVPB 500 mg/100 mL        500 mg 100 mL/hr over 60 Minutes Intravenous  Once 08/24/19 2255 08/25/19 0033   08/24/19 2200  meropenem (MERREM) 1 g in sodium chloride 0.9 % 100 mL IVPB     Discontinue     1 g 200 mL/hr over 30 Minutes Intravenous Every 8 hours 08/24/19 2159     08/24/19 2100  metroNIDAZOLE (FLAGYL) IVPB 500 mg        500 mg 100 mL/hr over 60 Minutes Intravenous  Once 08/24/19 2058 08/24/19 2238   08/24/19 2030  vancomycin (VANCOREADY) IVPB 1500 mg/300 mL        1,500 mg 150 mL/hr over 120 Minutes Intravenous  Once 08/24/19 2020 08/24/19 2331   08/24/19 2030  ceFEPIme (MAXIPIME) 2 g in sodium chloride 0.9 % 100 mL IVPB        2 g 200 mL/hr over 30 Minutes Intravenous  Once 08/24/19 2020 08/24/19 2128      Current Facility-Administered Medications  Medication Dose Route Frequency Provider Last Rate Last Admin  . 0.9 %  sodium chloride infusion   Intravenous Continuous Briscoe Deutscher, MD 125 mL/hr at 08/25/19 0649 New Bag at 08/25/19 3086  . enoxaparin (LOVENOX) injection 40 mg  40 mg Subcutaneous Q24H Earlie Lou L, MD   40 mg at 08/24/19 2243  . meropenem (MERREM) 1 g in sodium chloride 0.9 % 100 mL IVPB  1 g Intravenous Q8H Rometta Emery, MD   Stopped at 08/25/19 0731  . morphine 2 MG/ML injection 2 mg  2 mg  Intravenous Q2H PRN Earlie Lou L, MD      . ondansetron (ZOFRAN) tablet 4 mg  4 mg Oral Q6H PRN Rometta Emery, MD       Or  . ondansetron (ZOFRAN) injection 4 mg  4 mg Intravenous Q6H PRN Earlie Lou L, MD      . vancomycin (VANCOREADY) IVPB 1250 mg/250 mL  1,250 mg Intravenous Q12H Rometta Emery, MD       No current outpatient medications on file.   Objective: Vital signs in last 24 hours: Temp:  [98.4 F (36.9 C)-99.8 F (37.7 C)] 98.6 F (37 C) (07/29 0735) Pulse Rate:  [78-92] 83 (07/29 0735) Resp:  [17-18] 18 (07/29 0735) BP: (94-140)/(50-78) 94/50 (07/29 0735) SpO2:  [86 %-95 %] 89 % (07/29 0736) Weight:  [81.6 kg] 81.6 kg (07/28 1237)  Intake/Output from previous day: 07/28 0701 - 07/29 0700 In: 1491.9 [I.V.:1000; IV Piggyback:491.9] Out: -  Intake/Output this shift: Total I/O In: -  Out: 1000 [Urine:1000]  Physical Exam Vitals and nursing note reviewed.  Constitutional:      General: He is not in acute distress.    Appearance: He is not ill-appearing, toxic-appearing or diaphoretic.  HENT:  Head: Normocephalic and atraumatic.  Pulmonary:     Effort: Pulmonary effort is normal. No respiratory distress.  Genitourinary:    Comments: Diffuse scrotal erythema and erythema.  Bilateral descended testicles with palpable epididymides.  Scrotum tender to palpation throughout without areas of acute worsening.  No crepitus, induration, or fluctuance noted. Skin:    General: Skin is warm and dry.  Neurological:     Mental Status: He is alert and oriented to person, place, and time.  Psychiatric:        Mood and Affect: Mood normal.        Behavior: Behavior normal.    Lab Results:  Recent Labs    08/24/19 2204 08/25/19 0448  WBC 36.1* 28.5*  HGB 10.6* 10.5*  HCT 32.8* 31.6*  PLT 342 347   BMET Recent Labs    08/24/19 1242 08/24/19 1242 08/24/19 2204 08/25/19 0448  NA 135  --   --  140  K 4.5  --   --  4.2  CL 96*  --   --  100  CO2  28  --   --  30  GLUCOSE 117*  --   --  119*  BUN 19  --   --  20  CREATININE 1.21   < > 1.14 0.95  CALCIUM 8.8*  --   --  8.0*   < > = values in this interval not displayed.   Assessment & Plan: 54 year old male with UTI had bilateral epididymoorchitis, symptomatically improving on empiric antibiotics.  Physical exam reassuring for Fournier's gangrene this morning.  Okay to advance diet; no plans for operative intervention.  Recommend antibiotic therapy for management of this presentation.  Recommendations: -Advance diet -Continue antibiotics and follow urine cultures  Carman Ching, PA-C 08/25/2019

## 2019-08-25 NOTE — ED Notes (Signed)
Urology at bedside to consult.

## 2019-08-25 NOTE — ED Notes (Signed)
Pt O2 dropped to 88% RA while lying down and resting, placed on 2L  with O2 up 95%.  MD notified and aware.

## 2019-08-25 NOTE — ED Notes (Signed)
Assumed care of patient patient showing sats in the high 80's, denies sob, speech is clear, skin is appropriate for ethnicity, vss denies  Skin pink warm and dry. Reports only concern is swollen testicles, patient states no pain at present time.

## 2019-08-25 NOTE — ED Notes (Signed)
Pt refusing to wear nasal cannula, states it is giving him a funny taste in his mouth, heart burn, and bloated.  Patient maintaining around 88% RA but intermittently up to 91% RA, pt denies SOB and complaints.  MD aware, no further intervention needed at this time.  Will continue to monitor patient.

## 2019-08-25 NOTE — Progress Notes (Signed)
1        [place] at [facility]   PATIENT NAME: Zachary Joseph    MR#:  [id number]  DATE OF BIRTH:  1966-01-24  SUBJECTIVE:  CHIEF COMPLAINT:   Chief Complaint  Patient presents with  . Groin Swelling  His pain is much better control.  His scrotal swelling and tenderness seem to be better per patient REVIEW OF SYSTEMS:  Review of Systems  Constitutional: Negative for diaphoresis, fever, malaise/fatigue and weight loss.  HENT: Negative for ear discharge, ear pain, hearing loss, nosebleeds, sore throat and tinnitus.   Eyes: Negative for blurred vision and pain.  Respiratory: Negative for cough, hemoptysis, shortness of breath and wheezing.   Cardiovascular: Negative for chest pain, palpitations, orthopnea and leg swelling.  Gastrointestinal: Negative for abdominal pain, blood in stool, constipation, diarrhea, heartburn, nausea and vomiting.  Genitourinary: Negative for dysuria, frequency and urgency.       Scrotal swelling, erythema, tenderness  Musculoskeletal: Negative for back pain and myalgias.  Skin: Negative for itching and rash.  Neurological: Negative for dizziness, tingling, tremors, focal weakness, seizures, weakness and headaches.  Psychiatric/Behavioral: Negative for depression. The patient is not nervous/anxious.    DRUG ALLERGIES:  No Known Allergies VITALS:  Blood pressure 123/75, pulse 79, temperature 98.6 F (37 C), temperature source Oral, resp. rate 18, height 5\' 9"  (1.753 m), weight 81.6 kg, SpO2 (!) 88 %. PHYSICAL EXAMINATION:  Physical Exam HENT:     Head: Normocephalic and atraumatic.  Eyes:     Conjunctiva/sclera: Conjunctivae normal.     Pupils: Pupils are equal, round, and reactive to light.  Neck:     Thyroid: No thyromegaly.     Trachea: No tracheal deviation.  Cardiovascular:     Rate and Rhythm: Normal rate and regular rhythm.     Heart sounds: Normal heart sounds.  Pulmonary:     Effort: Pulmonary effort is normal. No respiratory  distress.     Breath sounds: Normal breath sounds. No wheezing.  Chest:     Chest wall: No tenderness.  Abdominal:     General: Bowel sounds are normal. There is no distension.     Palpations: Abdomen is soft.     Tenderness: There is no abdominal tenderness.  Genitourinary:    Comments: Diffuse scrotal tenderness, swelling and minimal erythema Musculoskeletal:        General: Normal range of motion.     Cervical back: Normal range of motion and neck supple.  Skin:    General: Skin is warm and dry.     Findings: No rash.  Neurological:     Mental Status: He is alert and oriented to person, place, and time.     Cranial Nerves: No cranial nerve deficit.    LABORATORY PANEL:  Male CBC Recent Labs  Lab 08/25/19 0448  WBC 28.5*  HGB 10.5*  HCT 31.6*  PLT 347   ------------------------------------------------------------------------------------------------------------------ Chemistries  Recent Labs  Lab 08/25/19 0448  NA 140  K 4.2  CL 100  CO2 30  GLUCOSE 119*  BUN 20  CREATININE 0.95  CALCIUM 8.0*  AST 36  ALT 28  ALKPHOS 78  BILITOT 0.8   RADIOLOGY:  CT ABDOMEN PELVIS W CONTRAST  Result Date: 08/24/2019 CLINICAL DATA:  Bilateral testicular pain and swelling and painful urination EXAM: CT ABDOMEN AND PELVIS WITH CONTRAST TECHNIQUE: Multidetector CT imaging of the abdomen and pelvis was performed using the standard protocol following bolus administration of intravenous contrast. CONTRAST:  08/26/2019 OMNIPAQUE  IOHEXOL 300 MG/ML  SOLN COMPARISON:  None. FINDINGS: Lower chest: The visualized heart size within normal limits. No pericardial fluid/thickening. No hiatal hernia. The visualized portions of the lungs are clear. Hepatobiliary: The liver is normal in density without focal abnormality.The main portal vein is patent. No evidence of calcified gallstones, gallbladder wall thickening or biliary dilatation. Pancreas: Unremarkable. No pancreatic ductal dilatation or  surrounding inflammatory changes. Spleen: Normal in size without focal abnormality. Adrenals/Urinary Tract: Both adrenal glands appear normal. Tiny bilateral hypodense lesions are seen within both kidneys, likely statistically renal cysts. No renal or collecting system calculi. The bladder is partially decompressed with apparent mild diffuse wall thickening and hyperenhancement. Stomach/Bowel: The stomach, small bowel, and colon are normal in appearance. Scattered colonic diverticula are noted. No inflammatory changes, wall thickening, or obstructive findings.The appendix is normal. Vascular/Lymphatic: There are no enlarged mesenteric, retroperitoneal, or pelvic lymph nodes. Scattered calcified and noncalcified plaque is noted. Reproductive: The prostate is unremarkable. Other: There is diffuse subcutaneous edema seen surrounding the bilateral testicles with small bilateral hydroceles. There is mild edema and wall thickening noted within the the perineal soft tissues. No subcutaneous emphysema is seen. Musculoskeletal: No acute or significant osseous findings. IMPRESSION: Findings which could be suggestive of diffuse cellulitis involving the bilateral scrotum with small bilateral hydroceles. No evidence of subcutaneous emphysema. Partially decompressed bladder with findings that could be suggestive mild cystitis. Aortic Atherosclerosis (ICD10-I70.0). Electronically Signed   By: Jonna Clark M.D.   On: 08/24/2019 21:05   US SCROTUM W/DOPPLER  Result Date: 08/25/2019 CLINICAL DATA:  Pain and swelling EXAM: SCROTAL ULTRASOUND DOPPLER ULTRASOUND OF THE TESTICLES TECHNIQUE: Complete ultrasound examination of the testicles, epididymis, and other scrotal structures was performed. Color and spectral Doppler ultrasound were also utilized to evaluate blood flow to the testicles. COMPARISON:  None. FINDINGS: Right testicle Measurements: 5.0 x 2.8 x 3.8 cm. No mass or microlithiasis visualized. Left testicle Measurements:  5.3 x 2.4 x 3.6 cm. No mass or microlithiasis visualized. Right epididymis:  Normal in size and appearance. Left epididymis: There is a anechoic cyst seen within the left is minimal head measuring 1.8 x 0.7 x 2.0 cm Hydrocele:  Small bilateral hydroceles are present. Varicocele:  None visualized. Pulsed Doppler interrogation of both testes demonstrates normal low resistance arterial and venous waveforms bilaterally. IMPRESSION: Small left epididymal head cyst Normal appearing testicles Small bilateral hydroceles Electronically Signed   By: Jonna Clark M.D.   On: 08/25/2019 02:12   ASSESSMENT AND PLAN:  54 year old male with UTI admitted for bilateral epididymoorchitis  Bilateral epididymal orchitis Present on admission Improving with vancomycin and meropenem Appreciate urology input No surgical intervention planned Pain medicine as needed  Mild UTI Based on UA, await urine culture.  Current antibiotic should cover urine infection if any  Leukocytosis Improving with antibiotics  Normocytic anemia Stable hemodynamics, outpatient follow-up with his PCP Body mass index is 26.58 kg/m.      Status is: Inpatient  Remains inpatient appropriate because:IV treatments appropriate due to intensity of illness or inability to take PO   Dispo: The patient is from: Home              Anticipated d/c is to: Home              Anticipated d/c date is: 2 days              Patient currently is not medically stable to d/c.  Continue IV antibiotic for clinical improvement with less erythema, swelling  and tenderness of the scrotal area     DVT prophylaxis:            enoxaparin (LOVENOX) injection 40 mg Start: 08/24/19 2200     Family Communication: Discussed with patient  All the records are reviewed and case discussed with Care Management/Social Worker. Management plans discussed with the patient, nursing and they are in agreement.  CODE STATUS: Full Code  TOTAL TIME TAKING CARE OF THIS  PATIENT: 35 minutes.   More than 50% of the time was spent in counseling/coordination of care: YES  POSSIBLE D/C IN 1-2 DAYS, DEPENDING ON CLINICAL CONDITION.   Delfino Lovett M.D on 08/25/2019 at 10:16 AM  [facility]   CC: Primary care physician; Patient, No Pcp Per  Note: This dictation was prepared with Dragon dictation along with smaller phrase technology. Any transcriptional errors that result from this process are unintentional.

## 2019-08-25 NOTE — ED Notes (Signed)
pt 02 in the 80s states thats his norm he is not SOB and refuses o2.

## 2019-08-26 LAB — URINE CULTURE: Culture: >=100000 — AB

## 2019-08-26 LAB — CBC
HCT: 33 % — ABNORMAL LOW (ref 39.0–52.0)
Hemoglobin: 10.3 g/dL — ABNORMAL LOW (ref 13.0–17.0)
MCH: 29.9 pg (ref 26.0–34.0)
MCHC: 31.2 g/dL (ref 30.0–36.0)
MCV: 95.7 fL (ref 80.0–100.0)
Platelets: 423 10*3/uL — ABNORMAL HIGH (ref 150–400)
RBC: 3.45 MIL/uL — ABNORMAL LOW (ref 4.22–5.81)
RDW: 12.9 % (ref 11.5–15.5)
WBC: 21.8 10*3/uL — ABNORMAL HIGH (ref 4.0–10.5)
nRBC: 0 % (ref 0.0–0.2)

## 2019-08-26 LAB — BASIC METABOLIC PANEL
Anion gap: 8 (ref 5–15)
BUN: 11 mg/dL (ref 6–20)
CO2: 29 mmol/L (ref 22–32)
Calcium: 8.1 mg/dL — ABNORMAL LOW (ref 8.9–10.3)
Chloride: 103 mmol/L (ref 98–111)
Creatinine, Ser: 0.92 mg/dL (ref 0.61–1.24)
GFR calc Af Amer: >60 mL/min (ref >60)
GFR calc non Af Amer: >60 mL/min (ref >60)
Glucose, Bld: 108 mg/dL — ABNORMAL HIGH (ref 70–99)
Potassium: 4.2 mmol/L (ref 3.5–5.1)
Sodium: 140 mmol/L (ref 135–145)

## 2019-08-26 MED ORDER — SODIUM CHLORIDE 0.9 % IV SOLN
2.0000 g | INTRAVENOUS | Status: DC
  Administered 2019-08-26 – 2019-08-27 (×2): 2 g via INTRAVENOUS
  Filled 2019-08-26 (×2): qty 2
  Filled 2019-08-26: qty 20

## 2019-08-26 NOTE — Progress Notes (Signed)
Pharmacy Antibiotic Note  Zachary Joseph is a 54 y.o. male admitted on 08/24/2019 with sepsis s/t scrotal cellulitis.  Pharmacy has been consulted for vancomycin dosing. Patient received vanc 2g IV load in ED.   Scr has improved since admission from 1.21>0.92.   Plan: Will continue Meropenem 1g q8h  Will continue vancomycin 1.25g IV q12h and continue to monitor renal function and make adjustments based on changes in renal function.   Will consider trough on 7/31 if patient remains on vancomycin.  Ke 0.0652 T1/2 ~ 12 hrs  Height: 5\' 9"  (175.3 cm) Weight: 81.6 kg (180 lb) IBW/kg (Calculated) : 70.7  Temp (24hrs), Avg:98.8 F (37.1 C), Min:97.5 F (36.4 C), Max:100.2 F (37.9 C)  Recent Labs  Lab 08/24/19 1242 08/24/19 2010 08/24/19 2204 08/25/19 0448 08/26/19 0449  WBC 41.6*  --  36.1* 28.5* 21.8*  CREATININE 1.21  --  1.14 0.95 0.92  LATICACIDVEN  --  1.3  --   --   --     Estimated Creatinine Clearance: 90.7 mL/min (by C-G formula based on SCr of 0.92 mg/dL).    No Known Allergies  Thank you for allowing pharmacy to be a part of this patient's care.  08/28/19, PharmD, BCPS Clinical Pharmacist 08/26/2019 7:19 AM

## 2019-08-26 NOTE — Progress Notes (Signed)
1        [place] at [facility]   PATIENT NAME: Zachary Joseph    MR#:  [id number]  DATE OF BIRTH:  [birth year]  SUBJECTIVE:  CHIEF COMPLAINT:   Chief Complaint  Patient presents with  . Groin Swelling  His pain is improving.  His scrotal swelling and tenderness continues to improve REVIEW OF SYSTEMS:  Review of Systems  Constitutional: Negative for diaphoresis, fever, malaise/fatigue and weight loss.  HENT: Negative for ear discharge, ear pain, hearing loss, nosebleeds, sore throat and tinnitus.   Eyes: Negative for blurred vision and pain.  Respiratory: Negative for cough, hemoptysis, shortness of breath and wheezing.   Cardiovascular: Negative for chest pain, palpitations, orthopnea and leg swelling.  Gastrointestinal: Negative for abdominal pain, blood in stool, constipation, diarrhea, heartburn, nausea and vomiting.  Genitourinary: Negative for dysuria, frequency and urgency.       Scrotal swelling, erythema, tenderness  Musculoskeletal: Negative for back pain and myalgias.  Skin: Negative for itching and rash.  Neurological: Negative for dizziness, tingling, tremors, focal weakness, seizures, weakness and headaches.  Psychiatric/Behavioral: Negative for depression. The patient is not nervous/anxious.    DRUG ALLERGIES:  No Known Allergies VITALS:  Blood pressure (!) 137/83, pulse 73, temperature 98.1 F (36.7 C), temperature source Oral, resp. rate 16, height 5\' 9"  (1.753 m), weight 81.6 kg, SpO2 92 %. PHYSICAL EXAMINATION:  Physical Exam HENT:     Head: Normocephalic and atraumatic.  Eyes:     Conjunctiva/sclera: Conjunctivae normal.     Pupils: Pupils are equal, round, and reactive to light.  Neck:     Thyroid: No thyromegaly.     Trachea: No tracheal deviation.  Cardiovascular:     Rate and Rhythm: Normal rate and regular rhythm.     Heart sounds: Normal heart sounds.  Pulmonary:     Effort: Pulmonary effort is normal. No respiratory distress.      Breath sounds: Normal breath sounds. No wheezing.  Chest:     Chest wall: No tenderness.  Abdominal:     General: Bowel sounds are normal. There is no distension.     Palpations: Abdomen is soft.     Tenderness: There is no abdominal tenderness.  Genitourinary:    Comments: Diffuse scrotal tenderness, swelling and minimal erythema Musculoskeletal:        General: Normal range of motion.     Cervical back: Normal range of motion and neck supple.  Skin:    General: Skin is warm and dry.     Findings: No rash.  Neurological:     Mental Status: He is alert and oriented to person, place, and time.     Cranial Nerves: No cranial nerve deficit.    LABORATORY PANEL:  Male CBC Recent Labs  Lab 08/26/19 0449  WBC 21.8*  HGB 10.3*  HCT 33.0*  PLT 423*   ------------------------------------------------------------------------------------------------------------------ Chemistries  Recent Labs  Lab 08/25/19 0448 08/25/19 0448 08/26/19 0449  NA 140   < > 140  K 4.2   < > 4.2  CL 100   < > 103  CO2 30   < > 29  GLUCOSE 119*   < > 108*  BUN 20   < > 11  CREATININE 0.95   < > 0.92  CALCIUM 8.0*   < > 8.1*  AST 36  --   --   ALT 28  --   --   ALKPHOS 78  --   --   BILITOT  0.8  --   --    < > = values in this interval not displayed.   RADIOLOGY:  No results found. ASSESSMENT AND PLAN:  54 year old male with UTI admitted for bilateral epididymoorchitis  Bilateral epididymal orchitis Present on admission Improving with vancomycin and meropenem Appreciate urology input No surgical intervention planned Pain medicine as needed  Mild UTI Based on UA, await urine culture.  Current antibiotic should cover urine infection if any  Leukocytosis Improving with antibiotics (36.5-> 28.5 -> 21.8)  Normocytic anemia Stable hemodynamics, outpatient follow-up with his PCP Body mass index is 26.58 kg/m.     Status is: Inpatient  Remains inpatient appropriate because:IV  treatments appropriate due to intensity of illness or inability to take PO   Dispo: The patient is from: Home              Anticipated d/c is to: Home              Anticipated d/c date is: 1 day              Patient currently is not medically stable to d/c.  Continue IV antibiotic for clinical improvement with less erythema, swelling and tenderness of the scrotal area     DVT prophylaxis:            enoxaparin (LOVENOX) injection 40 mg Start: 08/24/19 2200     Family Communication: Discussed with patient  All the records are reviewed and case discussed with Care Management/Social Worker. Management plans discussed with the patient, nursing and they are in agreement.  CODE STATUS: Full Code  TOTAL TIME TAKING CARE OF THIS PATIENT: 35 minutes.   More than 50% of the time was spent in counseling/coordination of care: YES  POSSIBLE D/C IN 1-2 DAYS, DEPENDING ON CLINICAL CONDITION.   Delfino Lovett M.D on 08/26/2019 at 10:48 AM  [facility]   CC: Primary care physician; Patient, No Pcp Per  Note: This dictation was prepared with Dragon dictation along with smaller phrase technology. Any transcriptional errors that result from this process are unintentional.

## 2019-08-27 LAB — BASIC METABOLIC PANEL
Anion gap: 10 (ref 5–15)
BUN: 12 mg/dL (ref 6–20)
CO2: 30 mmol/L (ref 22–32)
Calcium: 8.8 mg/dL — ABNORMAL LOW (ref 8.9–10.3)
Chloride: 99 mmol/L (ref 98–111)
Creatinine, Ser: 0.98 mg/dL (ref 0.61–1.24)
GFR calc Af Amer: >60 mL/min (ref >60)
GFR calc non Af Amer: >60 mL/min (ref >60)
Glucose, Bld: 111 mg/dL — ABNORMAL HIGH (ref 70–99)
Potassium: 4.4 mmol/L (ref 3.5–5.1)
Sodium: 139 mmol/L (ref 135–145)

## 2019-08-27 LAB — CBC
HCT: 36.6 % — ABNORMAL LOW (ref 39.0–52.0)
Hemoglobin: 11.7 g/dL — ABNORMAL LOW (ref 13.0–17.0)
MCH: 30 pg (ref 26.0–34.0)
MCHC: 32 g/dL (ref 30.0–36.0)
MCV: 93.8 fL (ref 80.0–100.0)
Platelets: 526 10*3/uL — ABNORMAL HIGH (ref 150–400)
RBC: 3.9 MIL/uL — ABNORMAL LOW (ref 4.22–5.81)
RDW: 12.9 % (ref 11.5–15.5)
WBC: 19.9 10*3/uL — ABNORMAL HIGH (ref 4.0–10.5)
nRBC: 0 % (ref 0.0–0.2)

## 2019-08-27 NOTE — Progress Notes (Signed)
PROGRESS NOTE    Zachary Joseph  ZOX:[id number] DOB: Nov 03, 1965 DOA: 08/24/2019 PCP: Patient, No Pcp Per    Brief Narrative:  HPI: Zachary Joseph is a 54 y.o. male with medical history significant of no significant past medical history who also has not been following up with a position on a regular basis last physician visit more than 2 years ago married and sexually active with one partner who started having scrotal swelling about 2 days ago and since then this morning it has turned warm red and more painful.  Pain is a 10 out of 10.  More on the left scrotum than the right.  Associated with some mild dysuria.  No fever or chills.  Patient has no diarrhea no nausea vomiting.  He was seen in the ER and diagnosed with possible scrotal cellulitis.  Urology consulted with recommendation for admission and IV antibiotics.  Patient to be seen by urology and to be kept n.p.o. after midnight.  He still complaining of pain otherwise no other complaints.  Patient said he was unable to walk at home due to the swelling.  Any slight movement brings in the pain.  His scrotum is overall inflamed but more so the testicles.  He has been admitted for further work-up and treatment.  7/31: Patient seen and examined.  Fever overnight noted.  T-max 100.6.  No new growth from my urine culture.  Patient appears on appropriate antibiotics.  White count downtrending.  Patient's pain improving.  Able to ambulate without difficulty.  Assessment & Plan:   Principal Problem:   Cellulitis of scrotum Active Problems:   Orchitis and epididymitis   Leucocytosis   Anemia   Tobacco abuse  Bilateral epididymal orchitis Present on admission Condition on broad-spectrum with vancomycin and meropenem Appreciate urology input No surgical intervention planned Antibiotics de-escalate to Rocephin Pain medicine as needed Pain improved, white count decreasing, no fevers over interval we will plan on transition to oral antibiotic and  discharge home tomorrow 08/28/2019  Mild UTI Creatinine urine culture positive for E. coli.  Patient on Rocephin.  Appears to be responding.  Continue current antibiotic  Leukocytosis I downtrending on antibiotics  Normocytic anemia No acute drop.  Suspect mild anemia of chronic disease.  No indication for transfusion.  Follow-up outpatient with PCP.    DVT prophylaxis: Lovenox  code Status: Full code Family Communication: None today Disposition Plan: Status is: Inpatient  Remains inpatient appropriate because:Inpatient level of care appropriate due to severity of illness   Dispo: The patient is from: Home              Anticipated d/c is to: Home              Anticipated d/c date is: 1 day              Patient currently is not medically stable to d/c.  Anticipate discharge tomorrow if patient continues to clinically improve, white count decreasing, no fevers over interval       Consultants:   Urology, signed off  Procedures:   None  Antimicrobials:   Rocephin   Subjective: Seen and examined.  Endorses some scrotal pain.  Reports fatigue.  Objective: Vitals:   08/26/19 1932 08/26/19 2157 08/27/19 0425 08/27/19 1200  BP: (!) 132/75  124/77 123/76  Pulse: 85  77 73  Resp: 17   16  Temp: (!) 100.6 F (38.1 C) 98.3 F (36.8 C) 98.2 F (36.8 C) 98.2 F (36.8 C)  TempSrc: Oral Oral Oral Oral  SpO2: 90%  95% 92%  Weight:      Height:        Intake/Output Summary (Last 24 hours) at 08/27/2019 1326 Last data filed at 08/27/2019 0900 Gross per 24 hour  Intake 720 ml  Output 575 ml  Net 145 ml   Filed Weights   08/24/19 1237  Weight: 81.6 kg    Examination:  General exam: Appears calm and comfortable  Respiratory system: Clear to auscultation. Respiratory effort normal. Cardiovascular system: S1 & S2 heard, RRR. No JVD, murmurs, rubs, gallops or clicks. No pedal edema. Gastrointestinal system: Abdomen is nondistended, soft and nontender. No  organomegaly or masses felt. Normal bowel sounds heard. GU: Genital exam significant for swollen scrotum.  Mildly tender to palpation. Central nervous system: Alert and oriented. No focal neurological deficits. Extremities: Symmetric 5 x 5 power. Skin: No rashes, lesions or ulcers Psychiatry: Judgement and insight appear normal. Mood & affect appropriate.     Data Reviewed: I have personally reviewed following labs and imaging studies  CBC: Recent Labs  Lab 08/24/19 1242 08/24/19 2204 08/25/19 0448 08/26/19 0449 08/27/19 0426  WBC 41.6* 36.1* 28.5* 21.8* 19.9*  HGB 11.9* 10.6* 10.5* 10.3* 11.7*  HCT 37.1* 32.8* 31.6* 33.0* 36.6*  MCV 92.5 93.4 90.5 95.7 93.8  PLT 365 342 347 423* 526*   Basic Metabolic Panel: Recent Labs  Lab 08/24/19 1242 08/24/19 2204 08/25/19 0448 08/26/19 0449 08/27/19 0426  NA 135  --  140 140 139  K 4.5  --  4.2 4.2 4.4  CL 96*  --  100 103 99  CO2 28  --  30 29 30   GLUCOSE 117*  --  119* 108* 111*  BUN 19  --  20 11 12   CREATININE 1.21 1.14 0.95 0.92 0.98  CALCIUM 8.8*  --  8.0* 8.1* 8.8*   GFR: Estimated Creatinine Clearance: 85.2 mL/min (by C-G formula based on SCr of 0.98 mg/dL). Liver Function Tests: Recent Labs  Lab 08/25/19 0448  AST 36  ALT 28  ALKPHOS 78  BILITOT 0.8  PROT 6.2*  ALBUMIN 2.6*   No results for input(s): LIPASE, AMYLASE in the last 168 hours. No results for input(s): AMMONIA in the last 168 hours. Coagulation Profile: No results for input(s): INR, PROTIME in the last 168 hours. Cardiac Enzymes: No results for input(s): CKTOTAL, CKMB, CKMBINDEX, TROPONINI in the last 168 hours. BNP (last 3 results) No results for input(s): PROBNP in the last 8760 hours. HbA1C: No results for input(s): HGBA1C in the last 72 hours. CBG: No results for input(s): GLUCAP in the last 168 hours. Lipid Profile: No results for input(s): CHOL, HDL, LDLCALC, TRIG, CHOLHDL, LDLDIRECT in the last 72 hours. Thyroid Function Tests: No  results for input(s): TSH, T4TOTAL, FREET4, T3FREE, THYROIDAB in the last 72 hours. Anemia Panel: No results for input(s): VITAMINB12, FOLATE, FERRITIN, TIBC, IRON, RETICCTPCT in the last 72 hours. Sepsis Labs: Recent Labs  Lab 08/24/19 2010  LATICACIDVEN 1.3    Recent Results (from the past 240 hour(s))  Urine culture     Status: Abnormal   Collection Time: 08/24/19 12:42 PM   Specimen: Urine, Random  Result Value Ref Range Status   Specimen Description   Final    URINE, RANDOM Performed at [facility], [address], [place], [address] [place]    Special Requests   Final    NONE Performed at [facility], [address] [place], [address]  [id number]    Culture >=100,000 COLONIES/mL ESCHERICHIA COLI (A)  Final   Report Status 08/26/2019 FINAL  Final   Organism ID, Bacteria ESCHERICHIA COLI (A)  Final      Susceptibility   Escherichia coli - MIC*    AMPICILLIN >=32 RESISTANT Resistant     CEFAZOLIN <=4 SENSITIVE Sensitive     CEFTRIAXONE <=0.25 SENSITIVE Sensitive     CIPROFLOXACIN <=0.25 SENSITIVE Sensitive     GENTAMICIN <=1 SENSITIVE Sensitive     IMIPENEM <=0.25 SENSITIVE Sensitive     NITROFURANTOIN <=16 SENSITIVE Sensitive     TRIMETH/SULFA >=320 RESISTANT Resistant     AMPICILLIN/SULBACTAM 16 INTERMEDIATE Intermediate     PIP/TAZO <=4 SENSITIVE Sensitive     * >=100,000 COLONIES/mL ESCHERICHIA COLI  Blood culture (routine x 2)     Status: None (Preliminary result)   Collection Time: 08/24/19  8:31 PM   Specimen: BLOOD  Result Value Ref Range Status   Specimen Description BLOOD RIGHT ANTECUBITAL  Final   Special Requests   Final    BOTTLES DRAWN AEROBIC AND ANAEROBIC Blood Culture adequate volume   Culture   Final    NO GROWTH 3 DAYS Performed at [facility], [address] [place], [place] [address]    Report Status PENDING  Incomplete  Blood culture (routine x 2)     Status: None (Preliminary result)   Collection Time:  08/24/19  8:31 PM   Specimen: BLOOD  Result Value Ref Range Status   Specimen Description BLOOD BLOOD RIGHT FOREARM  Final   Special Requests   Final    BOTTLES DRAWN AEROBIC AND ANAEROBIC Blood Culture adequate volume   Culture   Final    NO GROWTH 3 DAYS Performed at [facility], [address] [place], [place] [address]    Report Status PENDING  Incomplete  SARS Coronavirus 2 by RT PCR (hospital order, performed in [facility]) Nasopharyngeal Nasopharyngeal Swab     Status: None   Collection Time: 08/24/19  9:22 PM   Specimen: Nasopharyngeal Swab  Result Value Ref Range Status   SARS Coronavirus 2 NEGATIVE NEGATIVE Final    Comment: (NOTE) SARS-CoV-2 target nucleic acids are NOT DETECTED.  The SARS-CoV-2 RNA is generally detectable in upper and lower respiratory specimens during the acute phase of infection. The lowest concentration of SARS-CoV-2 viral copies this assay can detect is 250 copies / mL. A negative result does not preclude SARS-CoV-2 infection and should not be used as the sole basis for treatment or other patient management decisions.  A negative result may occur with improper specimen collection / handling, submission of specimen other than nasopharyngeal swab, presence of viral mutation(s) within the areas targeted by this assay, and inadequate number of viral copies (<250 copies / mL). A negative result must be combined with clinical observations, patient history, and epidemiological information.  Fact Sheet for Patients:   BoilerBrush.com.cyhttps://www.fda.gov/media/136312/download  Fact Sheet for Healthcare Providers: https://pope.com/https://www.fda.gov/media/136313/download  This test is not yet approved or  cleared by the [place] FDA and has been authorized for detection and/or diagnosis of SARS-CoV-2 by FDA under an Emergency Use Authorization (EUA).  This EUA will remain in effect (meaning this test can be used) for the duration of the COVID-19  declaration under Section 564(b)(1) of the Act, 21 U.S.C. section 360bbb-3(b)(1), unless the authorization is terminated or revoked sooner.  Performed at [facility], [address] [place], [place] [address]  Radiology Studies: No results found.      Scheduled Meds: . enoxaparin (LOVENOX) injection  40 mg Subcutaneous Q24H   Continuous Infusions: . sodium chloride 10 mL/hr at 08/26/19 0320  . cefTRIAXone (ROCEPHIN)  IV 2 g (08/26/19 1533)     LOS: 3 days    Time spent: 15 minutes    Tresa Moore, MD [facility] Pager [phone]  If 7PM-7AM, please contact night-coverage 08/27/2019, 1:26 PM

## 2019-08-28 MED ORDER — ACETAMINOPHEN 500 MG PO TABS: 1000.0000 mg | ORAL_TABLET | Freq: Four times a day (QID) | ORAL | 0 refills | Status: DC | PRN

## 2019-08-28 MED ORDER — AMOXICILLIN-POT CLAVULANATE 875-125 MG PO TABS
1.0000 | ORAL_TABLET | Freq: Two times a day (BID) | ORAL | Status: DC
  Administered 2019-08-28: 1 via ORAL
  Filled 2019-08-28: qty 1

## 2019-08-28 MED ORDER — AMOXICILLIN-POT CLAVULANATE 875-125 MG PO TABS: 1.0000 | ORAL_TABLET | Freq: Two times a day (BID) | ORAL | 0 refills | Status: AC

## 2019-08-28 MED ORDER — OXYCODONE HCL 5 MG PO TABS: 5.0000 mg | ORAL_TABLET | Freq: Three times a day (TID) | ORAL | 0 refills | Status: AC | PRN

## 2019-08-28 NOTE — Plan of Care (Signed)
The patient has been discharged. IV's have been removed. No falls. The patient has been stable no falls. The patient was informed of where to pick up his prescriptions order for him and how to set up a follow up appointment if needed with urology service.  Problem: Education: Goal: Knowledge of General Education information will improve Description: Including pain rating scale, medication(s)/side effects and non-pharmacologic comfort measures Outcome: Completed/Met   Problem: Health Behavior/Discharge Planning: Goal: Ability to manage health-related needs will improve Outcome: Completed/Met   Problem: Clinical Measurements: Goal: Ability to maintain clinical measurements within normal limits will improve Outcome: Completed/Met Goal: Will remain free from infection Outcome: Completed/Met Goal: Diagnostic test results will improve Outcome: Completed/Met Goal: Respiratory complications will improve Outcome: Completed/Met Goal: Cardiovascular complication will be avoided Outcome: Completed/Met   Problem: Activity: Goal: Risk for activity intolerance will decrease Outcome: Completed/Met   Problem: Nutrition: Goal: Adequate nutrition will be maintained Outcome: Completed/Met   Problem: Coping: Goal: Level of anxiety will decrease Outcome: Completed/Met   Problem: Elimination: Goal: Will not experience complications related to bowel motility Outcome: Completed/Met Goal: Will not experience complications related to urinary retention Outcome: Completed/Met   Problem: Pain Managment: Goal: General experience of comfort will improve Outcome: Completed/Met   Problem: Safety: Goal: Ability to remain free from injury will improve Outcome: Completed/Met   Problem: Skin Integrity: Goal: Risk for impaired skin integrity will decrease Outcome: Completed/Met

## 2019-08-28 NOTE — Discharge Summary (Signed)
Physician Discharge Summary  AZAM GERVASI ZOX:[id number] DOB: Sep 30, 1965 DOA: 08/24/2019  PCP: Patient, No Pcp Per  Admit date: 08/24/2019 Discharge date: 08/28/2019  Admitted From: Home Disposition: Home  Recommendations for Outpatient Follow-up:  1. Follow up with PCP in 1-2 weeks 2.   Home Health: No Equipment/Devices: None Discharge Condition: Stable CODE STATUS: Full Diet recommendation: Heart Healthy / Carb Modified  Brief/Interim Summary: WJX:BJYNW D Sladeis a 54 y.o.malewith medical history significant ofno significant past medical history who also has not been following up with a position on a regular basis last physician visit more than 2 years ago married and sexually active with one partner who started having scrotal swelling about 2 days ago and since then this morning it has turned warm red and more painful. Pain is a 10 out of 10. More on the left scrotum than the right. Associated with some mild dysuria. No fever or chills. Patient has no diarrhea no nausea vomiting. He was seen in the ER and diagnosed with possible scrotal cellulitis. Urology consulted with recommendation for admission and IV antibiotics. Patient to be seen by urology and to be kept n.p.o. after midnight. He still complaining of pain otherwise no other complaints. Patient said he was unable to walk at home due to the swelling. Any slight movement brings in the pain. His scrotum is overall inflamed but more so the testicles. He has been admitted for further work-up and treatment.  7/31: Patient seen and examined.  Fever overnight noted.  T-max 100.6.  No new growth from my urine culture.  Patient appears on appropriate antibiotics.  White count downtrending.  Patient's pain improving.  Able to ambulate without difficulty.  8/1: Patient seen and examined.  Scrotal pain much improved.  Low-grade temp noted yesterday but no fevers on the day of discharge.  White count downtrending.  Transition to  oral antibiotics and discharge home  Discharge Diagnoses:  Principal Problem:   Cellulitis of scrotum Active Problems:   Orchitis and epididymitis   Leucocytosis   Anemia   Tobacco abuse  Bilateral epididymal orchitis Present on admission Condition on broad-spectrum with vancomycin and meropenem Appreciate urology input No surgical intervention planned Antibiotics de-escalate to Rocephin Pain medicine as needed Pain improved, white count decreasing.  Low-grade temp noted the day prior to discharge.  No fevers noted on the day of discharge.  Considering consist improvement in symptoms will discharge patient home.  Transition to Augmentin.  Prescribed 10 days to complete total 14-day antibiotic course.  Clear return to ED instructions provided.  Follow-up with PCP.  Mild UTI Augmentin should cover E. coli in the urine.  Leukocytosis I downtrending on antibiotics  Normocytic anemia No acute drop.  Suspect mild anemia of chronic disease.  No indication for transfusion.  Follow-up outpatient with PCP.   Discharge Instructions  Discharge Instructions    Call MD for:  persistant nausea and vomiting   Complete by: As directed    Call MD for:  redness, tenderness, or signs of infection (pain, swelling, redness, odor or green/yellow discharge around incision site)   Complete by: As directed    Call MD for:  severe uncontrolled pain   Complete by: As directed    Call MD for:  temperature >100.4   Complete by: As directed    Diet - low sodium heart healthy   Complete by: As directed    Increase activity slowly   Complete by: As directed      Allergies as of 08/28/2019  No Known Allergies     Medication List    TAKE these medications   acetaminophen 500 MG tablet Commonly known as: TYLENOL Take 2 tablets (1,000 mg total) by mouth every 6 (six) hours as needed for mild pain, moderate pain or headache.   amoxicillin-clavulanate 875-125 MG tablet Commonly known as:  AUGMENTIN Take 1 tablet by mouth every 12 (twelve) hours for 10 days.   oxyCODONE 5 MG immediate release tablet Commonly known as: Roxicodone Take 1 tablet (5 mg total) by mouth every 8 (eight) hours as needed for up to 3 days.       No Known Allergies  Consultations: Urology  Procedures/Studies: CT ABDOMEN PELVIS W CONTRAST  Result Date: 08/24/2019 CLINICAL DATA:  Bilateral testicular pain and swelling and painful urination EXAM: CT ABDOMEN AND PELVIS WITH CONTRAST TECHNIQUE: Multidetector CT imaging of the abdomen and pelvis was performed using the standard protocol following bolus administration of intravenous contrast. CONTRAST:  100mL OMNIPAQUE IOHEXOL 300 MG/ML  SOLN COMPARISON:  None. FINDINGS: Lower chest: The visualized heart size within normal limits. No pericardial fluid/thickening. No hiatal hernia. The visualized portions of the lungs are clear. Hepatobiliary: The liver is normal in density without focal abnormality.The main portal vein is patent. No evidence of calcified gallstones, gallbladder wall thickening or biliary dilatation. Pancreas: Unremarkable. No pancreatic ductal dilatation or surrounding inflammatory changes. Spleen: Normal in size without focal abnormality. Adrenals/Urinary Tract: Both adrenal glands appear normal. Tiny bilateral hypodense lesions are seen within both kidneys, likely statistically renal cysts. No renal or collecting system calculi. The bladder is partially decompressed with apparent mild diffuse wall thickening and hyperenhancement. Stomach/Bowel: The stomach, small bowel, and colon are normal in appearance. Scattered colonic diverticula are noted. No inflammatory changes, wall thickening, or obstructive findings.The appendix is normal. Vascular/Lymphatic: There are no enlarged mesenteric, retroperitoneal, or pelvic lymph nodes. Scattered calcified and noncalcified plaque is noted. Reproductive: The prostate is unremarkable. Other: There is diffuse  subcutaneous edema seen surrounding the bilateral testicles with small bilateral hydroceles. There is mild edema and wall thickening noted within the the perineal soft tissues. No subcutaneous emphysema is seen. Musculoskeletal: No acute or significant osseous findings. IMPRESSION: Findings which could be suggestive of diffuse cellulitis involving the bilateral scrotum with small bilateral hydroceles. No evidence of subcutaneous emphysema. Partially decompressed bladder with findings that could be suggestive mild cystitis. Aortic Atherosclerosis (ICD10-I70.0). Electronically Signed   By: Jonna ClarkBindu  Avutu M.D.   On: 08/24/2019 21:05   US SCROTUM W/DOPPLER  Result Date: 08/25/2019 CLINICAL DATA:  Pain and swelling EXAM: SCROTAL ULTRASOUND DOPPLER ULTRASOUND OF THE TESTICLES TECHNIQUE: Complete ultrasound examination of the testicles, epididymis, and other scrotal structures was performed. Color and spectral Doppler ultrasound were also utilized to evaluate blood flow to the testicles. COMPARISON:  None. FINDINGS: Right testicle Measurements: 5.0 x 2.8 x 3.8 cm. No mass or microlithiasis visualized. Left testicle Measurements: 5.3 x 2.4 x 3.6 cm. No mass or microlithiasis visualized. Right epididymis:  Normal in size and appearance. Left epididymis: There is a anechoic cyst seen within the left is minimal head measuring 1.8 x 0.7 x 2.0 cm Hydrocele:  Small bilateral hydroceles are present. Varicocele:  None visualized. Pulsed Doppler interrogation of both testes demonstrates normal low resistance arterial and venous waveforms bilaterally. IMPRESSION: Small left epididymal head cyst Normal appearing testicles Small bilateral hydroceles Electronically Signed   By: Jonna ClarkBindu  Avutu M.D.   On: 08/25/2019 02:12    (Echo, Carotid, EGD, Colonoscopy, ERCP)    Subjective: Patient  seen and examined on day of discharge.  No complaints, feels well.  Stable for discharge home.  Discharge Exam: Vitals:   08/28/19 1217  08/28/19 1348  BP: 120/74 125/75  Pulse: 70 72  Resp: 16 18  Temp: 98.6 F (37 C) 97.7 F (36.5 C)  SpO2: 92% 95%   Vitals:   08/27/19 2114 08/28/19 0421 08/28/19 1217 08/28/19 1348  BP: 127/78 108/68 120/74 125/75  Pulse: 81 74 70 72  Resp: Temp: (!) 100.6 F (38.1 C) 98.5 F (36.9 C) 98.6 F (37 C) 97.7 F (36.5 C)  TempSrc: Oral Oral Oral Oral  SpO2: 90% 93% 92% 95%  Weight:      Height:        General: Pt is alert, awake, not in acute distress Cardiovascular: RRR, S1/S2 +, no rubs, no gallops Respiratory: CTA bilaterally, no wheezing, no rhonchi Abdominal: Soft, NT, ND, bowel sounds + Extremities: no edema, no cyanosis    The results of significant diagnostics from this hospitalization (including imaging, microbiology, ancillary and laboratory) are listed below for reference.     Microbiology: Recent Results (from the past 240 hour(s))  Urine culture     Status: Abnormal   Collection Time: 08/24/19 12:42 PM   Specimen: Urine, Random  Result Value Ref Range Status   Specimen Description   Final    URINE, RANDOM Performed at [facility], [address] [place], [place] [address]    Special Requests   Final    NONE Performed at [facility], [address] [place], [place] [address]    Culture >=100,000 COLONIES/mL ESCHERICHIA COLI (A)  Final   Report Status 08/26/2019 FINAL  Final   Organism ID, Bacteria ESCHERICHIA COLI (A)  Final      Susceptibility   Escherichia coli - MIC*    AMPICILLIN >=32 RESISTANT Resistant     CEFAZOLIN <=4 SENSITIVE Sensitive     CEFTRIAXONE <=0.25 SENSITIVE Sensitive     CIPROFLOXACIN <=0.25 SENSITIVE Sensitive     GENTAMICIN <=1 SENSITIVE Sensitive     IMIPENEM <=0.25 SENSITIVE Sensitive     NITROFURANTOIN <=16 SENSITIVE Sensitive     TRIMETH/SULFA >=320 RESISTANT Resistant     AMPICILLIN/SULBACTAM 16 INTERMEDIATE Intermediate     PIP/TAZO <=4 SENSITIVE Sensitive     * >=100,000  COLONIES/mL ESCHERICHIA COLI  Blood culture (routine x 2)     Status: None (Preliminary result)   Collection Time: 08/24/19  8:31 PM   Specimen: BLOOD  Result Value Ref Range Status   Specimen Description BLOOD RIGHT ANTECUBITAL  Final   Special Requests   Final    BOTTLES DRAWN AEROBIC AND ANAEROBIC Blood Culture adequate volume   Culture   Final    NO GROWTH 4 DAYS Performed at [facility], [address] [place], [place] [address]    Report Status PENDING  Incomplete  Blood culture (routine x 2)     Status: None (Preliminary result)   Collection Time: 08/24/19  8:31 PM   Specimen: BLOOD  Result Value Ref Range Status   Specimen Description BLOOD BLOOD RIGHT FOREARM  Final   Special Requests   Final    BOTTLES DRAWN AEROBIC AND ANAEROBIC Blood Culture adequate volume   Culture   Final    NO GROWTH 4 DAYS Performed at [facility], [address] [place], [place] [address]    Report Status PENDING  Incomplete  SARS Coronavirus 2 by RT PCR (  hospital order, performed in [facility] hospital lab) Nasopharyngeal Nasopharyngeal Swab     Status: None   Collection Time: 08/24/19  9:22 PM   Specimen: Nasopharyngeal Swab  Result Value Ref Range Status   SARS Coronavirus 2 NEGATIVE NEGATIVE Final    Comment: (NOTE) SARS-CoV-2 target nucleic acids are NOT DETECTED.  The SARS-CoV-2 RNA is generally detectable in upper and lower respiratory specimens during the acute phase of infection. The lowest concentration of SARS-CoV-2 viral copies this assay can detect is 250 copies / mL. A negative result does not preclude SARS-CoV-2 infection and should not be used as the sole basis for treatment or other patient management decisions.  A negative result may occur with improper specimen collection / handling, submission of specimen other than nasopharyngeal swab, presence of viral mutation(s) within the areas targeted by this assay, and inadequate number of viral  copies (<250 copies / mL). A negative result must be combined with clinical observations, patient history, and epidemiological information.  Fact Sheet for Patients:   BoilerBrush.com.cy  Fact Sheet for Healthcare Providers: https://pope.com/  This test is not yet approved or  cleared by the [place] FDA and has been authorized for detection and/or diagnosis of SARS-CoV-2 by FDA under an Emergency Use Authorization (EUA).  This EUA will remain in effect (meaning this test can be used) for the duration of the COVID-19 declaration under Section 564(b)(1) of the Act, 21 U.S.C. section 360bbb-3(b)(1), unless the authorization is terminated or revoked sooner.  Performed at [facility], [address] [place], [place] [address]      Labs: BNP (last 3 results) No results for input(s): BNP in the last 8760 hours. Basic Metabolic Panel: Recent Labs  Lab 08/24/19 1242 08/24/19 2204 08/25/19 0448 08/26/19 0449 08/27/19 0426  NA 135  --  140 140 139  K 4.5  --  4.2 4.2 4.4  CL 96*  --  100 103 99  CO2 28  --  30 29 30   GLUCOSE 117*  --  119* 108* 111*  BUN 19  --  20 11 12   CREATININE 1.21 1.14 0.95 0.92 0.98  CALCIUM 8.8*  --  8.0* 8.1* 8.8*   Liver Function Tests: Recent Labs  Lab 08/25/19 0448  AST 36  ALT 28  ALKPHOS 78  BILITOT 0.8  PROT 6.2*  ALBUMIN 2.6*   No results for input(s): LIPASE, AMYLASE in the last 168 hours. No results for input(s): AMMONIA in the last 168 hours. CBC: Recent Labs  Lab 08/24/19 1242 08/24/19 2204 08/25/19 0448 08/26/19 0449 08/27/19 0426  WBC 41.6* 36.1* 28.5* 21.8* 19.9*  HGB 11.9* 10.6* 10.5* 10.3* 11.7*  HCT 37.1* 32.8* 31.6* 33.0* 36.6*  MCV 92.5 93.4 90.5 95.7 93.8  PLT 365 342 347 423* 526*   Cardiac Enzymes: No results for input(s): CKTOTAL, CKMB, CKMBINDEX, TROPONINI in the last 168 hours. BNP: Invalid input(s): POCBNP CBG: No results for input(s):  GLUCAP in the last 168 hours. D-Dimer No results for input(s): DDIMER in the last 72 hours. Hgb A1c No results for input(s): HGBA1C in the last 72 hours. Lipid Profile No results for input(s): CHOL, HDL, LDLCALC, TRIG, CHOLHDL, LDLDIRECT in the last 72 hours. Thyroid function studies No results for input(s): TSH, T4TOTAL, T3FREE, THYROIDAB in the last 72 hours.  Invalid input(s): FREET3 Anemia work up No results for input(s): VITAMINB12, FOLATE, FERRITIN, TIBC, IRON, RETICCTPCT in the last 72 hours. Urinalysis    Component Value Date/Time   COLORURINE AMBER (  A) 08/24/2019 1242   APPEARANCEUR CLOUDY (A) 08/24/2019 1242   LABSPEC 1.019 08/24/2019 1242   PHURINE 5.0 08/24/2019 1242   GLUCOSEU NEGATIVE 08/24/2019 1242   HGBUR LARGE (A) 08/24/2019 1242   BILIRUBINUR NEGATIVE 08/24/2019 1242   KETONESUR NEGATIVE 08/24/2019 1242   PROTEINUR 100 (A) 08/24/2019 1242   NITRITE NEGATIVE 08/24/2019 1242   LEUKOCYTESUR LARGE (A) 08/24/2019 1242   Sepsis Labs Invalid input(s): PROCALCITONIN,  WBC,  LACTICIDVEN Microbiology Recent Results (from the past 240 hour(s))  Urine culture     Status: Abnormal   Collection Time: 08/24/19 12:42 PM   Specimen: Urine, Random  Result Value Ref Range Status   Specimen Description   Final    URINE, RANDOM Performed at [facility], [address] [place], [place] [address]    Special Requests   Final    NONE Performed at [facility], [address] [place], [place] [address]    Culture >=100,000 COLONIES/mL ESCHERICHIA COLI (A)  Final   Report Status 08/26/2019 FINAL  Final   Organism ID, Bacteria ESCHERICHIA COLI (A)  Final      Susceptibility   Escherichia coli - MIC*    AMPICILLIN >=32 RESISTANT Resistant     CEFAZOLIN <=4 SENSITIVE Sensitive     CEFTRIAXONE <=0.25 SENSITIVE Sensitive     CIPROFLOXACIN <=0.25 SENSITIVE Sensitive     GENTAMICIN <=1 SENSITIVE Sensitive     IMIPENEM <=0.25 SENSITIVE Sensitive      NITROFURANTOIN <=16 SENSITIVE Sensitive     TRIMETH/SULFA >=320 RESISTANT Resistant     AMPICILLIN/SULBACTAM 16 INTERMEDIATE Intermediate     PIP/TAZO <=4 SENSITIVE Sensitive     * >=100,000 COLONIES/mL ESCHERICHIA COLI  Blood culture (routine x 2)     Status: None (Preliminary result)   Collection Time: 08/24/19  8:31 PM   Specimen: BLOOD  Result Value Ref Range Status   Specimen Description BLOOD RIGHT ANTECUBITAL  Final   Special Requests   Final    BOTTLES DRAWN AEROBIC AND ANAEROBIC Blood Culture adequate volume   Culture   Final    NO GROWTH 4 DAYS Performed at [facility], [address] [place], [place] [address]    Report Status PENDING  Incomplete  Blood culture (routine x 2)     Status: None (Preliminary result)   Collection Time: 08/24/19  8:31 PM   Specimen: BLOOD  Result Value Ref Range Status   Specimen Description BLOOD BLOOD RIGHT FOREARM  Final   Special Requests   Final    BOTTLES DRAWN AEROBIC AND ANAEROBIC Blood Culture adequate volume   Culture   Final    NO GROWTH 4 DAYS Performed at [facility], [address], [place], [place] [address]    Report Status PENDING  Incomplete  SARS Coronavirus 2 by RT PCR (hospital order, performed in [facility] hospital lab) Nasopharyngeal Nasopharyngeal Swab     Status: None   Collection Time: 08/24/19  9:22 PM   Specimen: Nasopharyngeal Swab  Result Value Ref Range Status   SARS Coronavirus 2 NEGATIVE NEGATIVE Final    Comment: (NOTE) SARS-CoV-2 target nucleic acids are NOT DETECTED.  The SARS-CoV-2 RNA is generally detectable in upper and lower respiratory specimens during the acute phase of infection. The lowest concentration of SARS-CoV-2 viral copies this assay can detect is 250 copies / mL. A negative result does not preclude SARS-CoV-2 infection and should not be used as the sole basis for treatment or other patient management decisions.  A negative result may occur with improper  specimen collection / handling, submission of specimen other than nasopharyngeal swab, presence of viral mutation(s) within the areas targeted by this assay, and inadequate number of viral copies (<250 copies / mL). A negative result must be combined with clinical observations, patient history, and epidemiological information.  Fact Sheet for Patients:   BoilerBrush.com.cy  Fact Sheet for Healthcare Providers: https://pope.com/  This test is not yet approved or  cleared by the [place] FDA and has been authorized for detection and/or diagnosis of SARS-CoV-2 by FDA under an Emergency Use Authorization (EUA).  This EUA will remain in effect (meaning this test can be used) for the duration of the COVID-19 declaration under Section 564(b)(1) of the Act, 21 U.S.C. section 360bbb-3(b)(1), unless the authorization is terminated or revoked sooner.  Performed at [facility], [address] [place], [place] [address]      Time coordinating discharge: Over 30 minutes  SIGNED:   Tresa Moore, MD  [facility] 08/28/2019, 2:29 PM Pager   If 7PM-7AM, please contact night-coverage

## 2019-08-29 LAB — CULTURE, BLOOD (ROUTINE X 2)
Culture: NO GROWTH
Culture: NO GROWTH
Special Requests: ADEQUATE
Special Requests: ADEQUATE

## 2019-09-01 ENCOUNTER — Encounter: Payer: Self-pay | Admitting: Urology

## 2019-09-01 ENCOUNTER — Other Ambulatory Visit: Payer: Self-pay

## 2019-09-01 ENCOUNTER — Ambulatory Visit (INDEPENDENT_AMBULATORY_CARE_PROVIDER_SITE_OTHER): Payer: No Typology Code available for payment source | Admitting: Urology

## 2019-09-01 VITALS — BP 120/74 | HR 75 | Ht 69.0 in | Wt 173.0 lb

## 2019-09-01 DIAGNOSIS — N3000 Acute cystitis without hematuria: Secondary | ICD-10-CM | POA: Diagnosis not present

## 2019-09-01 DIAGNOSIS — N453 Epididymo-orchitis: Secondary | ICD-10-CM

## 2019-09-01 NOTE — Progress Notes (Signed)
09/01/2019 11:05 AM   Zachary Joseph [birth year] [id number]  Referring provider: No referring provider defined for this encounter.  Chief Complaint  Patient presents with   Recurrent UTI    HPI: 54 year old male who presents today for hospital follow-up of UTI/severe epididymoorchitis.  He was seen and evaluated in the emergency room on 7/22,021 with worsening urinary symptoms including dysuria, fevers, and progressive pain and swelling of his scrotum.  He was ultimately diagnosed with epididymoorchitis as well as a urinary tract infection.  Urine culture grew E. coli resistant to ampicillin and Bactrim.  Ultimately, he improved and was discharged on Augmentin.  He continues to take this medication.  He was just discharged over the weekend.  He reports that his urinary symptoms are subsiding.  He no longer has dysuria.  No gross hematuria.  The pain and swelling in his scrotum has improved significantly but has not completely resolved.  Prior to this, he has not been seeing a doctor on a regular basis.  Has not had any prostate cancer screening or health maintenance.  He does report that he was having some baseline urinary symptoms prior to developing his urinary tract infection.  No previous UTIs.   PMH: History reviewed. No pertinent past medical history.  Surgical History: Past Surgical History:  Procedure Laterality Date   HERNIA REPAIR      Home Medications:  Allergies as of 09/01/2019   No Known Allergies     Medication List       Accurate as of September 01, 2019 11:05 AM. If you have any questions, ask your nurse or doctor.        acetaminophen 500 MG tablet Commonly known as: TYLENOL Take 2 tablets (1,000 mg total) by mouth every 6 (six) hours as needed for mild pain, moderate pain or headache.   amoxicillin-clavulanate 875-125 MG tablet Commonly known as: AUGMENTIN Take 1 tablet by mouth every 12 (twelve) hours for 10 days.       Allergies: No Known  Allergies  Family History: History reviewed. No pertinent family history.  Social History:  reports that he has been smoking cigarettes. He has never used smokeless tobacco. He reports previous alcohol use. He reports previous drug use.   Physical Exam: BP 120/74    Pulse 75    Ht 5\' 9"  (1.753 m)    Wt 173 lb (78.5 kg)    BMI 25.55 kg/m   Constitutional:  Alert and oriented, No acute distress. HEENT: [place] AT, moist mucus membranes.  Trachea midline, no masses. Cardiovascular: No clubbing, cyanosis, or edema. Respiratory: Normal respiratory effort, no increased work of breathing. GU: Uncircumcised phallus with easily retractable foreskin.  Bilateral testicles descended.  Marked improvement of scrotal wall thickening and erythema is appreciated last week in the hospital.  Bilateral testicles remain mildly firm and enlarged bilaterally but decreased from previous.  No fluctuance. Skin: No rashes, bruises or suspicious lesions. Neurologic: Grossly intact, no focal deficits, moving all 4 extremities. Psychiatric: Normal mood and affect.  Laboratory Data: Lab Results  Component Value Date   WBC 19.9 (H) 08/27/2019   HGB 11.7 (L) 08/27/2019   HCT 36.6 (L) 08/27/2019   MCV 93.8 08/27/2019   PLT 526 (H) 08/27/2019    Lab Results  Component Value Date   CREATININE 0.98 08/27/2019    Urinalysis UA today with 6-10 WBC, 11-30 RBC no bacteria, nitrite negative   Assessment & Plan:    1. Acute cystitis without hematuria Continue Augmentin  We will recheck his urine culture today to ensure that his infection is clearing, urinalysis is dramatically improved from previous  We discussed the pathophysiology of urinary tract infection in adult men.  Is likely that he is some underlying urinary obstruction contributing to his presentation.  Plan to have him come back next month for IPSS/PVR/PSA and DRE for health maintenance and BPH work-up.  See below - Urinalysis, Complete - CULTURE,  URINE COMPREHENSIVE  2. Orchitis and epididymitis Improving, continue supportive care  Return if scrotal swelling worsens - PSA; Future   Vanna Scotland, MD  [facility] [address], [address] [place], [place] [address] [phone]

## 2019-09-02 LAB — MICROSCOPIC EXAMINATION: Bacteria, UA: NONE SEEN

## 2019-09-02 LAB — URINALYSIS, COMPLETE
Bilirubin, UA: NEGATIVE
Glucose, UA: NEGATIVE
Ketones, UA: NEGATIVE
Leukocytes,UA: NEGATIVE
Nitrite, UA: NEGATIVE
Specific Gravity, UA: 1.015 (ref 1.005–1.030)
Urobilinogen, Ur: 0.2 mg/dL (ref 0.2–1.0)
pH, UA: 6.5 (ref 5.0–7.5)

## 2019-09-05 LAB — CULTURE, URINE COMPREHENSIVE

## 2019-09-19 ENCOUNTER — Telehealth: Payer: Self-pay

## 2019-09-19 NOTE — Telephone Encounter (Signed)
Patient called stating that his scrotal swelling is not any better and he is very uncomfortable. Patient was scheduled for PA visit for further evaluation per Dr. Delana Meyer note

## 2019-09-20 ENCOUNTER — Other Ambulatory Visit: Payer: Self-pay

## 2019-09-20 ENCOUNTER — Ambulatory Visit (INDEPENDENT_AMBULATORY_CARE_PROVIDER_SITE_OTHER): Payer: No Typology Code available for payment source | Admitting: Physician Assistant

## 2019-09-20 ENCOUNTER — Ambulatory Visit
Admission: RE | Admit: 2019-09-20 | Discharge: 2019-09-20 | Disposition: A | Discharge status: Home / Self Care | Payer: PRIVATE HEALTH INSURANCE | Source: Ambulatory Visit | Attending: Physician Assistant | Admitting: Physician Assistant

## 2019-09-20 VITALS — BP 142/87 | HR 75 | Ht 69.0 in | Wt 179.8 lb

## 2019-09-20 DIAGNOSIS — K921 Melena: Secondary | ICD-10-CM | POA: Diagnosis not present

## 2019-09-20 DIAGNOSIS — N5082 Scrotal pain: Secondary | ICD-10-CM

## 2019-09-20 DIAGNOSIS — R310 Gross hematuria: Secondary | ICD-10-CM

## 2019-09-20 MED ORDER — CIPROFLOXACIN HCL 500 MG PO TABS: 500.0000 mg | ORAL_TABLET | Freq: Two times a day (BID) | ORAL | 0 refills | Status: AC

## 2019-09-20 NOTE — Patient Instructions (Addendum)
1. Scrotal pain 1. Start scrotal support: wear tight, compressive underwear, apply ice to the scrotum (no longer than 20 minutes at a time, always with a layer of fabric between the skin and the ice), and use a rolled up washcloth to elevate the scrotum when you are resting. 2. Start Cipro, I have sent this to your pharmacy. 3. We will get a scrotal ultrasound to make sure you do not have any scrotal abscesses (pockets of infection) contributing to your symptoms. 2. Blood in the urine 1. You need a cystoscopy to look inside your bladder to evaluate you for sources of bleeding, as you are at risk of bladder cancer with your smoking history. I have scheduled this with Dr. Apolinar Junes next month. It is very important that you keep this appointment. 3. Blood in the stool 1. You need to be seen by a GI doctor and are overdue for a colonoscopy. I have placed a referral for this today. You will receive a phone call to schedule an appointment from their office. 4. Primary care 1. You need to establish care with a primary care provider. I recommend reaching out to [facility] at [phone] to request an appointment.  Cystoscopy Cystoscopy is a procedure that is used to help diagnose and sometimes treat conditions that affect the lower urinary tract. The lower urinary tract includes the bladder and the urethra. The urethra is the tube that drains urine from the bladder. Cystoscopy is done using a thin, tube-shaped instrument with a light and camera at the end (cystoscope). The cystoscope may be hard or flexible, depending on the goal of the procedure. The cystoscope is inserted through the urethra, into the bladder. Cystoscopy may be recommended if you have:  Urinary tract infections that keep coming back.  Blood in the urine (hematuria).  An inability to control when you urinate (urinary incontinence) or an overactive bladder.  Unusual cells found in a urine sample.  A blockage in the urethra, such  as a urinary stone.  Painful urination.  An abnormality in the bladder found during an intravenous pyelogram (IVP) or CT scan. Cystoscopy may also be done to remove a sample of tissue to be examined under a microscope (biopsy). Tell a health care provider about:  Any allergies you have.  All medicines you are taking, including vitamins, herbs, eye drops, creams, and over-the-counter medicines.  Any problems you or family members have had with anesthetic medicines.  Any blood disorders you have.  Any surgeries you have had.  Any medical conditions you have.  Whether you are pregnant or may be pregnant. What are the risks? Generally, this is a safe procedure. However, problems may occur, including:  Infection.  Bleeding.  Allergic reactions to medicines.  Damage to other structures or organs. What happens before the procedure?  Ask your health care provider about: ? Changing or stopping your regular medicines. This is especially important if you are taking diabetes medicines or blood thinners. ? Taking medicines such as aspirin and ibuprofen. These medicines can thin your blood. Do not take these medicines unless your health care provider tells you to take them. ? Taking over-the-counter medicines, vitamins, herbs, and supplements.  Follow instructions from your health care provider about eating or drinking restrictions.  Ask your health care provider what steps will be taken to help prevent infection. These may include: ? Washing skin with a germ-killing soap. ? Taking antibiotic medicine.  You may have an exam or testing, such as: ? X-rays of  the bladder, urethra, or kidneys. ? Urine tests to check for signs of infection.  Plan to have someone take you home from the hospital or clinic. What happens during the procedure?   You will be given one or more of the following: ? A medicine to help you relax (sedative). ? A medicine to numb the area (local  anesthetic).  The area around the opening of your urethra will be cleaned.  The cystoscope will be passed through your urethra into your bladder.  Germ-free (sterile) fluid will flow through the cystoscope to fill your bladder. The fluid will stretch your bladder so that your health care provider can clearly examine your bladder walls.  Your doctor will look at the urethra and bladder. Your doctor may take a biopsy or remove stones.  The cystoscope will be removed, and your bladder will be emptied. The procedure may vary among health care providers and hospitals. What can I expect after the procedure? After the procedure, it is common to have:  Some soreness or pain in your abdomen and urethra.  Urinary symptoms. These include: ? Mild pain or burning when you urinate. Pain should stop within a few minutes after you urinate. This may last for up to 1 week. ? A small amount of blood in your urine for several days. ? Feeling like you need to urinate but producing only a small amount of urine. Follow these instructions at home: Medicines  Take over-the-counter and prescription medicines only as told by your health care provider.  If you were prescribed an antibiotic medicine, take it as told by your health care provider. Do not stop taking the antibiotic even if you start to feel better. General instructions  Return to your normal activities as told by your health care provider. Ask your health care provider what activities are safe for you.  Do not drive for 24 hours if you were given a sedative during your procedure.  Watch for any blood in your urine. If the amount of blood in your urine increases, call your health care provider.  Follow instructions from your health care provider about eating or drinking restrictions.  If a tissue sample was removed for testing (biopsy) during your procedure, it is up to you to get your test results. Ask your health care provider, or the  department that is doing the test, when your results will be ready.  Drink enough fluid to keep your urine pale yellow.  Keep all follow-up visits as told by your health care provider. This is important. Contact a health care provider if you:  Have pain that gets worse or does not get better with medicine, especially pain when you urinate.  Have trouble urinating.  Have more blood in your urine. Get help right away if you:  Have blood clots in your urine.  Have abdominal pain.  Have a fever or chills.  Are unable to urinate. Summary  Cystoscopy is a procedure that is used to help diagnose and sometimes treat conditions that affect the lower urinary tract.  Cystoscopy is done using a thin, tube-shaped instrument with a light and camera at the end.  After the procedure, it is common to have some soreness or pain in your abdomen and urethra.  Watch for any blood in your urine. If the amount of blood in your urine increases, call your health care provider.  If you were prescribed an antibiotic medicine, take it as told by your health care provider. Do not stop taking  the antibiotic even if you start to feel better. This information is not intended to replace advice given to you by your health care provider. Make sure you discuss any questions you have with your health care provider. Document Revised: 01/05/2018 Document Reviewed: 01/05/2018 Elsevier Patient Education  [place].

## 2019-09-20 NOTE — Progress Notes (Signed)
09/20/2019 1:17 PM   Elliot Dally 1965/10/01 [id number]  CC: Chief Complaint  Patient presents with  . Groin Swelling    HPI: Zachary Joseph is a 54 y.o. male with a recent history of E. coli UTI and epididymoorchitis who presents today for evaluation of persistent scrotal swelling and pain.  He was seen most recently by Dr. Erlene Quan on 09/01/2019 for outpatient follow-up of his recent infection. He completed a total of 13 days of Augmentin for this.  Today he reports continued scrotal pain that has gradually improved with time but remains bothersome. He states the pain is interfering in his work, where he lifts heavy objects regularly.  Of note, patient reports intermittent gross hematuria dating back multiple years, possible to 2014. He has never seen a urologist for this previously. He has an approximate 60 pack-year smoking history. CT AP with contrast dated 08/24/2019 with diffuse bladder wall thickening and hyperenhancement suggestive of mild cystitis. No outside medical records or historical UAs available for review.  He also reports intermittent hematochezia. He has never been evaluated by a healthcare provider for this. He does not have a primary care provider. He has never had a colonoscopy.  In-office UA today positive for trace ketones, 3+ blood, 1+ protein, and 1+ leukocyte esterase; urine microscopy with 11-30 WBCs/HPF and 11-30 RBCs/HPF.  PMH: No past medical history on file.  Surgical History: Past Surgical History:  Procedure Laterality Date  . HERNIA REPAIR      Home Medications:  Allergies as of 09/20/2019   No Known Allergies     Medication List       Accurate as of September 20, 2019  1:17 PM. If you have any questions, ask your nurse or doctor.        acetaminophen 500 MG tablet Commonly known as: TYLENOL Take 2 tablets (1,000 mg total) by mouth every 6 (six) hours as needed for mild pain, moderate pain or headache.   ciprofloxacin 500 MG  tablet Commonly known as: Cipro Take 1 tablet (500 mg total) by mouth 2 (two) times daily for 7 days. Started by: Debroah Loop, PA-C       Allergies:  No Known Allergies  Family History: No family history on file.  Social History:   reports that he has been smoking cigarettes. He has never used smokeless tobacco. He reports previous alcohol use. He reports previous drug use.  Physical Exam: BP (!) 142/87 (BP Location: Left Arm, Patient Position: Sitting, Cuff Size: Normal)   Pulse 75   Ht _0  (1.753 m)   Wt 179 lb 12.8 oz (81.6 kg)   BMI 26.55 kg/m   Constitutional:  Alert and oriented, no acute distress, nontoxic appearing HEENT: [place], AT Cardiovascular: No clubbing, cyanosis, or edema Respiratory: Normal respiratory effort, no increased work of breathing GU: Bilateral descended testicles, intact vasa. Edematous bilateral epididymides, R>L. No scrotal induration, crepitus, or drainage noted. Skin: No rashes, bruises or suspicious lesions Neurologic: Grossly intact, no focal deficits, moving all 4 extremities Psychiatric: Normal mood and affect  Laboratory Data: Results for orders placed or performed in visit on 09/20/19  Microscopic Examination   Urine  Result Value Ref Range   WBC, UA 11-30 (A) 0 - 5 /hpf   RBC 11-30 (A) 0 - 2 /hpf   Epithelial Cells (non renal) 0-10 0 - 10 /hpf   Casts Present (A) None seen /lpf   Cast Type Hyaline casts N/A   Mucus, UA Present (A) Not  Estab.   Bacteria, UA Few None seen/Few  Urinalysis, Complete  Result Value Ref Range   Specific Gravity, UA 1.025 1.005 - 1.030   pH, UA 6.0 5.0 - 7.5   Color, UA Yellow Yellow   Appearance Ur Cloudy (A) Clear   Leukocytes,UA 1+ (A) Negative   Protein,UA 1+ (A) Negative/Trace   Glucose, UA Negative Negative   Ketones, UA Trace (A) Negative   RBC, UA 3+ (A) Negative   Bilirubin, UA Negative Negative   Urobilinogen, Ur 0.2 0.2 - 1.0 mg/dL   Nitrite, UA Negative Negative    Microscopic Examination See below:    Assessment & Plan:   54 year old male with a recent episode of UTI with bilateral epididymoorchitis completed culture appropriate antibiotics presents with concerns of continued scrotal pain, also with reports of gross hematuria and hematochezia without a PCP. 1. Scrotal pain Likely typical residual scrotal pain in the setting of recent epididymoorchitis, however UA is suggestive of persistent infection.  Will send for culture for further evaluation.  Additionally, will obtain scrotal ultrasound for evaluation of possible contributory abscesses.  We will start him on empiric Cipro at this time.  Counseled him on scrotal support including compressive underwear, cryotherapy, and scrotal elevation.  Expressed understanding. - Urinalysis, Complete - CULTURE, URINE COMPREHENSIVE - US SCROTUM W/DOPPLER - ciprofloxacin (CIPRO) 500 MG tablet; Take 1 tablet (500 mg total) by mouth 2 (two) times daily for 7 days.  Dispense: 14 tablet; Refill: 0  2. Gross hematuria I had a lengthy conversation today with the patient regarding the etiology of blood in the urine.  I explained that blood in the urine can be caused by a myriad of factors, including but not limited to infection, stones, cysts, anticoagulation, and urinary tract malignancies.  I explained that the recommended work-up for blood in the urine is twofold and includes a CT urogram for evaluation of the upper urinary tract including kidneys and ureters as well as a cystoscopy for evaluation of the urethra and bladder.  I explained that these two studies complement one another in reviewing the entire urinary tract for possible causes of bleeding.  Given his recent CT AP with contrast, I recommended that we proceed with cystoscopy alone at this time. Ultimately if he requires TURBT I recommend bilateral retrograde pyelograms to complete the workup.  Patient agreed; cysto scheduled.  3. Hematochezia Referral placed for  GI today. Explained he is overdue for colonoscopy and they would likely recommend this. - Ambulatory referral to Gastroenterology   Return in about 2 weeks (around 10/04/2019) for Cystoscopy with Dr. Erlene Quan.  Debroah Loop, PA-C  [facility] [address] [place], [place] [address] [phone]

## 2019-09-21 ENCOUNTER — Telehealth: Payer: Self-pay | Admitting: Physician Assistant

## 2019-09-21 LAB — URINALYSIS, COMPLETE
Bilirubin, UA: NEGATIVE
Glucose, UA: NEGATIVE
Nitrite, UA: NEGATIVE
Specific Gravity, UA: 1.025 (ref 1.005–1.030)
Urobilinogen, Ur: 0.2 mg/dL (ref 0.2–1.0)
pH, UA: 6 (ref 5.0–7.5)

## 2019-09-21 LAB — MICROSCOPIC EXAMINATION

## 2019-09-21 NOTE — Telephone Encounter (Signed)
Please contact the patient and inform him that his scrotal ultrasound did not show any abscesses or pockets of infection that could be contributing to his ongoing symptoms. It did show residual inflammation of the tissue following his recent infection.   Please counsel him to continue antibiotics as prescribed and scrotal support with compressive underwear, cryotherapy, and scrotal elevation. He may take Tylenol for pain control.

## 2019-09-22 NOTE — Telephone Encounter (Signed)
Notified patient as advised. Patient verbalized understanding.  

## 2019-09-23 LAB — CULTURE, URINE COMPREHENSIVE

## 2019-09-27 ENCOUNTER — Other Ambulatory Visit: Payer: Self-pay

## 2019-09-27 ENCOUNTER — Other Ambulatory Visit: Payer: No Typology Code available for payment source

## 2019-09-27 DIAGNOSIS — N453 Epididymo-orchitis: Secondary | ICD-10-CM

## 2019-09-28 LAB — PSA: Prostate Specific Ag, Serum: 3.4 ng/mL (ref 0.0–4.0)

## 2019-09-29 ENCOUNTER — Ambulatory Visit (INDEPENDENT_AMBULATORY_CARE_PROVIDER_SITE_OTHER): Payer: No Typology Code available for payment source | Admitting: Physician Assistant

## 2019-09-29 ENCOUNTER — Encounter: Payer: Self-pay | Admitting: Physician Assistant

## 2019-09-29 ENCOUNTER — Ambulatory Visit: Payer: No Typology Code available for payment source | Admitting: Gastroenterology

## 2019-09-29 ENCOUNTER — Other Ambulatory Visit: Payer: Self-pay

## 2019-09-29 VITALS — BP 150/92 | HR 74 | Ht 69.0 in | Wt 179.6 lb

## 2019-09-29 DIAGNOSIS — N400 Enlarged prostate without lower urinary tract symptoms: Secondary | ICD-10-CM

## 2019-09-29 DIAGNOSIS — N5082 Scrotal pain: Secondary | ICD-10-CM

## 2019-09-29 LAB — BLADDER SCAN AMB NON-IMAGING

## 2019-09-29 NOTE — Patient Instructions (Signed)

## 2019-09-29 NOTE — Progress Notes (Signed)
09/29/2019 3:00 PM   Zachary Joseph 1965-12-26 [id number]  CC: Chief Complaint  Patient presents with  . Follow-up    HPI: Zachary Joseph is a 54 y.o. male with a recent history of E. coli UTI and epididymoorchitis who presents today for BPH workup.  I saw him in clinic most recently on 09/20/2019 for evaluation of persistent scrotal swelling and pain.  Scrotal ultrasound at that point was reassuring for residual abscess.  I counseled him to continue scrotal support at that time. Notably, he also reported a years-long history of intermittent gross hematuria at that visit. He is scheduled for cystoscopy with Dr. Apolinar Junes next week.  IPSS 4/2 as below.  PVR 61mL.  PSA dated 09/27/2019 was 3.4, no historical data for comparison.  CTAP with contrast dated 08/24/2019 with an approximate 4.5 x 4.7 x 3.6 cm unremarkable prostate with no pelvic lymphadenopathy.  Today he reports significant improvement in scrotal swelling and pain since I last saw him.   IPSS    Row Name 09/29/19 1300         International Prostate Symptom Score   How often have you had the sensation of not emptying your bladder? Less than 1 in 5     How often have you had to urinate less than every two hours? Not at All     How often have you found you stopped and started again several times when you urinated? Not at All     How often have you found it difficult to postpone urination? Less than 1 in 5 times     How often have you had a weak urinary stream? Not at All     How often have you had to strain to start urination? Not at All     How many times did you typically get up at night to urinate? 2 Times     Total IPSS Score 4       Quality of Life due to urinary symptoms   If you were to spend the rest of your life with your urinary condition just the way it is now how would you feel about that? Mostly Satisfied           PMH: No past medical history on file.  Surgical History: Past Surgical History:  Procedure  Laterality Date  . HERNIA REPAIR      Home Medications:  Allergies as of 09/29/2019   No Known Allergies     Medication List       Accurate as of September 29, 2019  3:00 PM. If you have any questions, ask your nurse or doctor.        acetaminophen 500 MG tablet Commonly known as: TYLENOL Take 2 tablets (1,000 mg total) by mouth every 6 (six) hours as needed for mild pain, moderate pain or headache.       Allergies:  No Known Allergies  Family History: No family history on file.  Social History:   reports that he has been smoking cigarettes. He has never used smokeless tobacco. He reports previous alcohol use. He reports previous drug use.  Physical Exam: BP (!) 150/92 (BP Location: Left Arm, Patient Position: Sitting, Cuff Size: Normal)   Pulse 74   Ht 5\' 9"  (1.753 m)   Wt 179 lb 9.6 oz (81.5 kg)   BMI 26.52 kg/m   Constitutional:  Alert and oriented, no acute distress, nontoxic appearing HEENT: [place], AT Cardiovascular: No clubbing, cyanosis, or edema Respiratory:  Normal respiratory effort, no increased work of breathing GU: No external hemorrhoids. Normal sphincter tone. Smooth, enlarged, approximate 40+cc prostate without nodules or induration. Palpation limited to the mid-gland and apex. Skin: No rashes, bruises or suspicious lesions Neurologic: Grossly intact, no focal deficits, moving all 4 extremities Psychiatric: Normal mood and affect  Laboratory Data: Results for orders placed or performed in visit on 09/29/19  Bladder Scan (Post Void Residual) in office  Result Value Ref Range   Scan Result 39mL    Assessment & Plan:   1. Benign prostatic hyperplasia without lower urinary tract symptoms PSA WNL; recommend continued annual screening PSA to trend. He reports mild LUTS today and is emptying appropriately per PVR. DRE with no significant findings, though I was unable to palpate the base of the gland.   No indication to initiate pharmacotherapy today.  Minimally symptomatic, mildly enlarged prostate. Will plan for annual office follow-up. Counseled patient to keep scheduled cystoscopy with Dr. Apolinar Junes next week. He expressed understanding. - Bladder Scan (Post Void Residual) in office   Return in about 5 days (around 10/04/2019) for cystoscopy with Dr. Apolinar Junes.  Carman Ching, PA-C  [facility] [address] [place], [place] [address] [phone]

## 2019-09-30 NOTE — Progress Notes (Signed)
° °  10/04/2019  CC:  Chief Complaint  Patient presents with   Cysto    HPI: Zachary Joseph is a 54 y.o. male who presents for a cystoscopy.   CT A/P with contrast dated 08/24/2019 with an approximate 4.5 x 4.7 x 3.6 cm unremarkable prostate with no pelvic lymphadenopathy.  Most recent PSA was 3.4 as of 09/27/2019.   Patient was last seen in clinic in 09/29/2019 with Carman Ching, PA-C. Scrotal ultrasound from 09/20/19 was reassuring for residual abscess. He reported significant improvement in scrotal swelling and pain. PVR was 19 mL.   He reports blood in his stool. He has yet to be seen in GI. He reports seeing blood when lifting weights during exercise.   Patient has a years-long history of intermittent gross hematuria.   Blood pressure (!) 157/93, pulse 80, height 5\' 9"  (1.753 m), weight 179 lb (81.2 kg). NED. A&Ox3.   No respiratory distress   Abd soft, NT, ND Normal phallus with bilateral descended testicles  Cystoscopy Procedure Note  Patient identification was confirmed, informed consent was obtained, and patient was prepped using Betadine solution.  Lidocaine jelly was administered per urethral meatus.     Pre-Procedure: - Inspection reveals a normal caliber ureteral meatus.  Procedure: The flexible cystoscope was introduced without difficulty - No urethral strictures/lesions are present. - Urethra appeared somewhat ragged, with whitish fuzzy plaque like material involving the majority of his bulbar urethra - Normal prostate  - Normal bladder neck - Bilateral ureteral orifices identified - Small area of the bilateral bladder wall with very subtle erythema on the posterior wall, ? scope trauma - Bladder mucosa  reveals no ulcers, tumors, or lesions - No bladder stones - No trabeculation  Retroflexion shows no abnormalities.    Post-Procedure: - Patient tolerated the procedure well  Urinalysis Shows microscopic hematuria   Assessment/ Plan:  1.  Gross/microscopic hematuria/ urethral lesion Patient received Keflex prior to cystoscopy. Cystoscopy is unremarkable for bladder Urethra had a somewhat of an unusual appearance, differential diagnosis includes leukoplakia, urethritis versus malignancy Given proximity to recent severe infection, will allow additional time and reassess RTC in 4-6 weeks for follow up cystoscopy. If symptoms are persistent I will proceed with a biopsy of the urethra.  -Urine cytology sent.    RTC in 4-6 week for repeat cystoscopy.   We had also previous referred the patient to gastroenterology for blood in his stool.  We followed up on this and appears that they have been reaching out to him and he has not returned the phone call.  Encouraged him to return the call.  Additionally, he was given the name and contact information of a PCP accepting new patients today.  , am acting as a scribe for Dr. Georges Mouse.  I have reviewed the above documentation for accuracy and completeness, and I agree with the above.   Vanna Scotland, MD

## 2019-10-04 ENCOUNTER — Other Ambulatory Visit: Payer: Self-pay | Admitting: Urology

## 2019-10-04 ENCOUNTER — Ambulatory Visit (INDEPENDENT_AMBULATORY_CARE_PROVIDER_SITE_OTHER): Payer: No Typology Code available for payment source | Admitting: Urology

## 2019-10-04 ENCOUNTER — Encounter: Payer: Self-pay | Admitting: Urology

## 2019-10-04 ENCOUNTER — Other Ambulatory Visit: Payer: Self-pay

## 2019-10-04 VITALS — BP 157/93 | HR 80 | Ht 69.0 in | Wt 179.0 lb

## 2019-10-04 DIAGNOSIS — R310 Gross hematuria: Secondary | ICD-10-CM | POA: Diagnosis not present

## 2019-10-04 MED ORDER — CEPHALEXIN 250 MG PO CAPS
500.0000 mg | ORAL_CAPSULE | Freq: Once | ORAL | Status: AC
  Administered 2019-10-04: 500 mg via ORAL

## 2019-10-04 NOTE — Patient Instructions (Addendum)
PCP  [facility]  Address: [address] [place] [address] Phone: [phone]

## 2019-10-05 LAB — URINALYSIS, COMPLETE
Bilirubin, UA: NEGATIVE
Glucose, UA: NEGATIVE
Ketones, UA: NEGATIVE
Leukocytes,UA: NEGATIVE
Nitrite, UA: NEGATIVE
Specific Gravity, UA: 1.025 (ref 1.005–1.030)
Urobilinogen, Ur: 0.2 mg/dL (ref 0.2–1.0)
pH, UA: 7.5 (ref 5.0–7.5)

## 2019-10-05 LAB — MICROSCOPIC EXAMINATION: Bacteria, UA: NONE SEEN

## 2019-10-06 LAB — CYTOLOGY - NON PAP

## 2019-11-09 ENCOUNTER — Other Ambulatory Visit: Payer: Self-pay | Admitting: Urology

## 2019-11-09 ENCOUNTER — Telehealth: Payer: Self-pay | Admitting: Urology

## 2019-11-09 NOTE — Telephone Encounter (Signed)
Patient came into the office today to cancel his repeat cysto appointment with Dr.Brandon.  He states that he was terminated from his job and he no longer has insurance.    I encouraged him to keep his appointment and we would work out a payment plan for the charges of today's visit.  Patient refused to keep the appointment.   He states that he will call back when he secures new insurance to reschedule the appointment.

## 2019-12-01 ENCOUNTER — Ambulatory Visit: Payer: No Typology Code available for payment source | Admitting: Gastroenterology

## 2020-12-02 IMAGING — CT CT ABD-PELV W/ CM
2 of 5 series · 16 of 46 positions shown, 18 images · IV contrast (APPLIED)
Comparison: None.

CLINICAL DATA: Bilateral testicular pain and swelling and painful
urination

EXAM:
CT ABDOMEN AND PELVIS WITH CONTRAST
TECHNIQUE: Multidetector CT imaging of the abdomen and pelvis was performed
using the standard protocol following bolus administration of
intravenous contrast.
CONTRAST:  100mL OMNIPAQUE IOHEXOL 300 MG/ML  SOLN

[Series 5: coronal st · coronal · 0.72mm/px · 3 of 86 slices shown]
[im 29/86  soft-tissue]
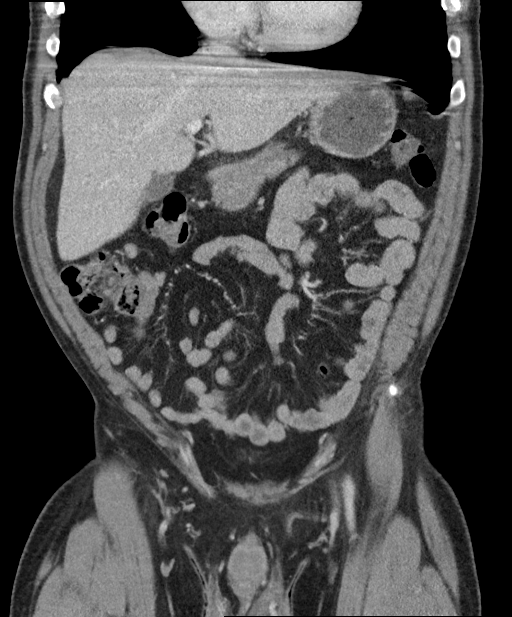
[im 38/86  soft-tissue]
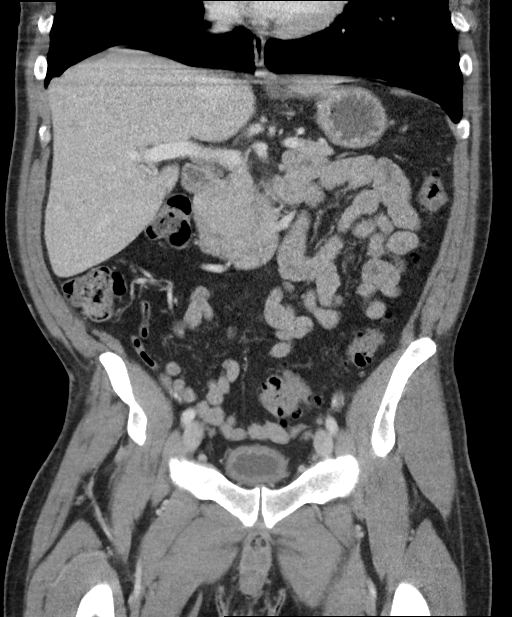
[im 48/86  soft-tissue]
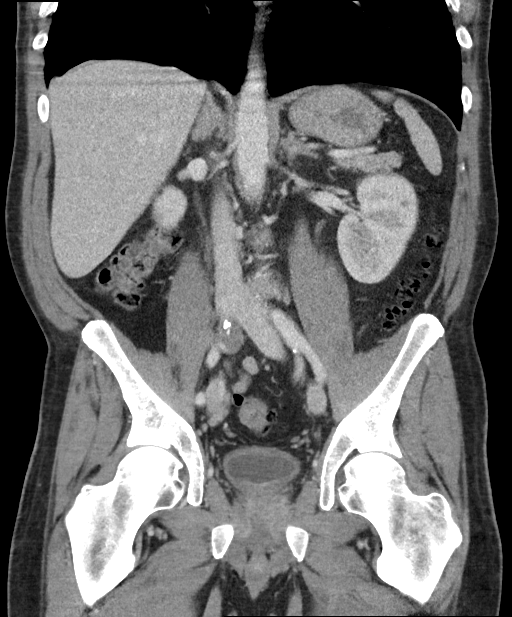

[Series 7: delay · axial · delayed · 0.72mm/px · z∈[-571,-146]mm · 13 of 101 slices shown, 15 images]
[im 8/101  soft-tissue]
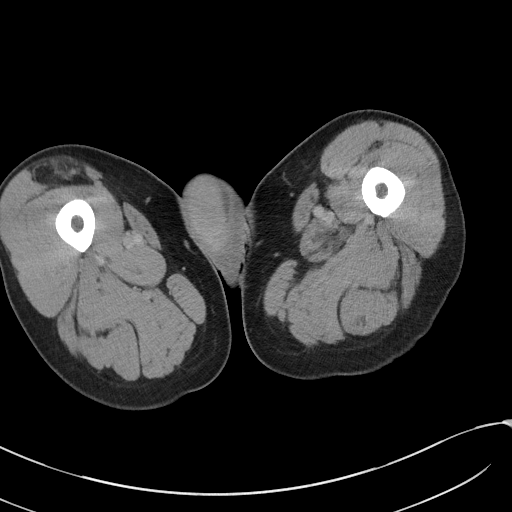
[im 8/101  bone]
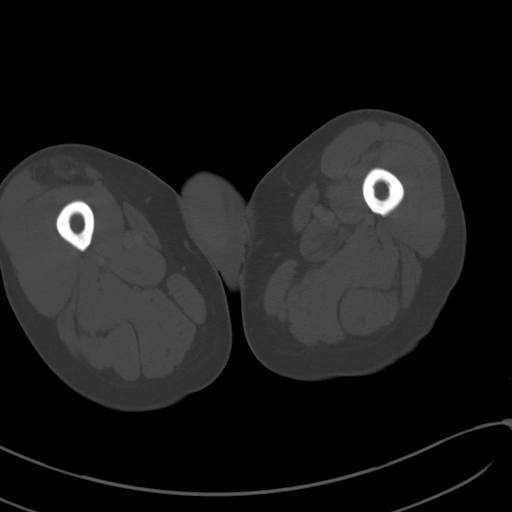
[im 15/101  soft-tissue]
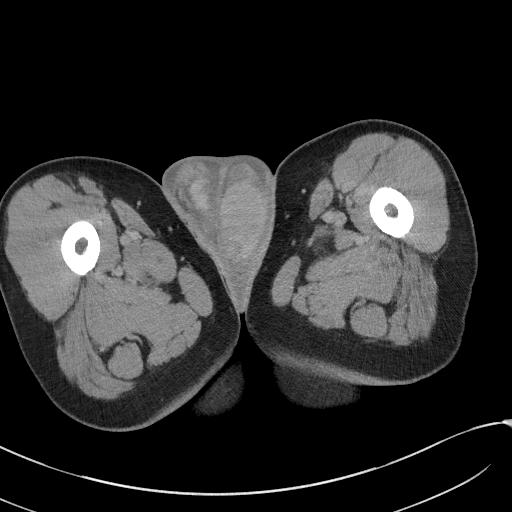
[im 22/101  soft-tissue]
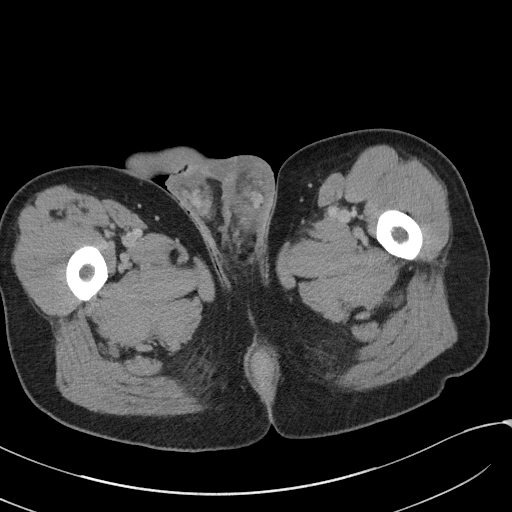
[im 29/101  soft-tissue]
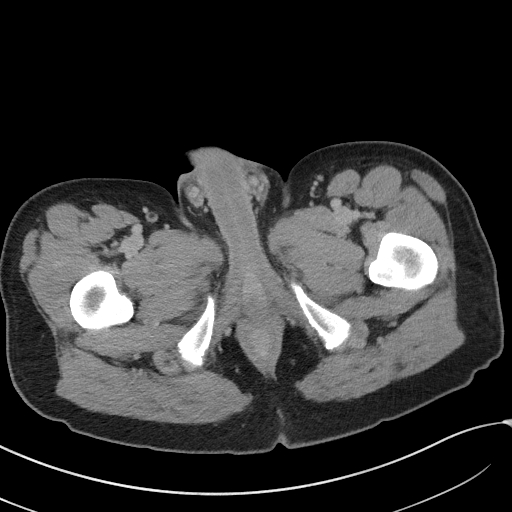
[im 36/101  soft-tissue]
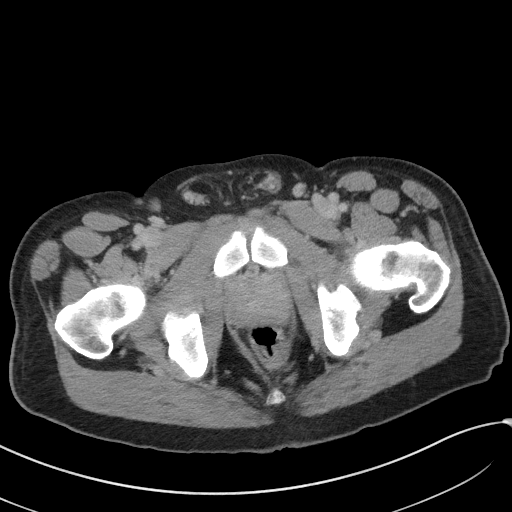
[im 43/101  soft-tissue]
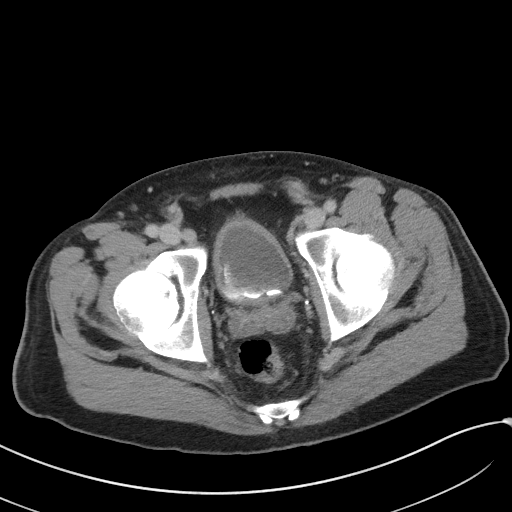
[im 51/101  soft-tissue]
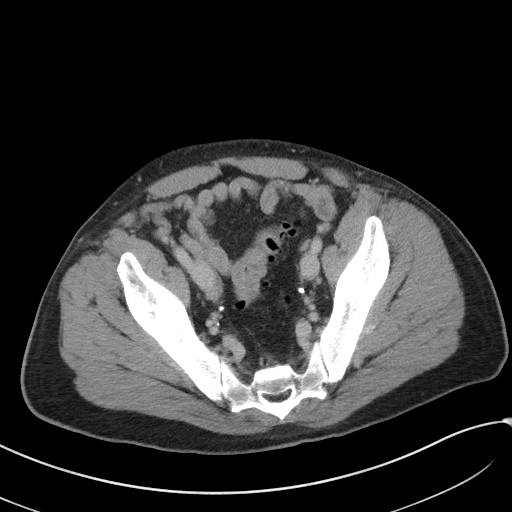
[im 58/101  soft-tissue]
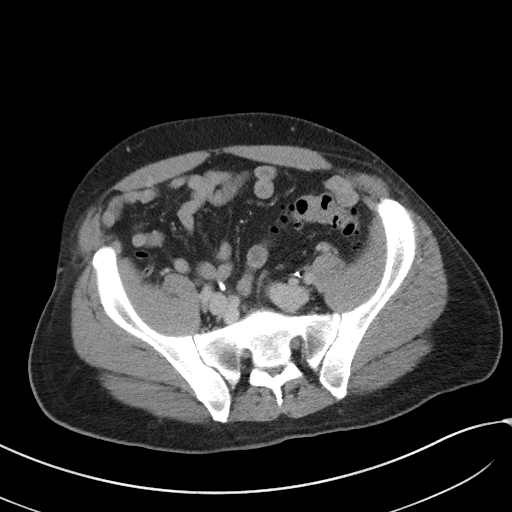
[im 65/101  soft-tissue]
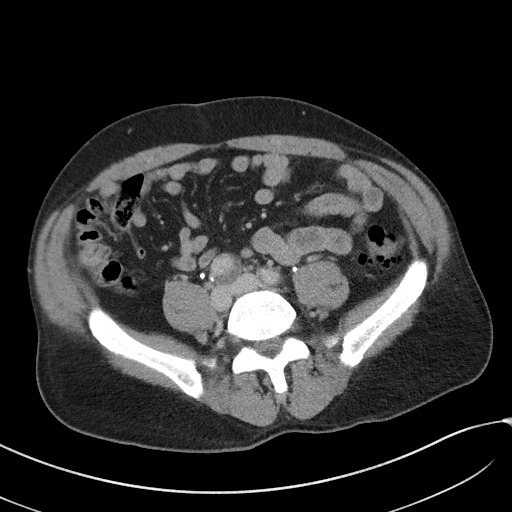
[im 65/101  bone]
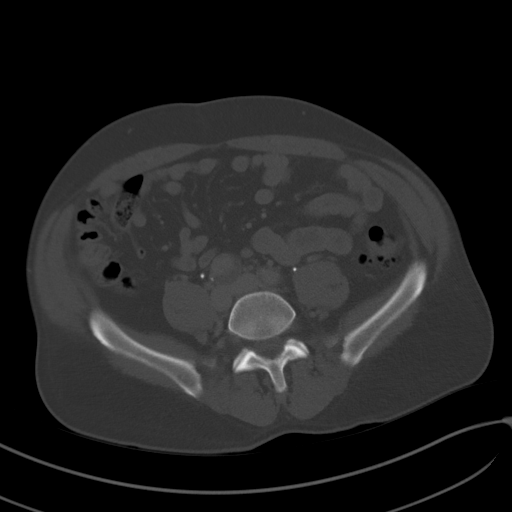
[im 72/101  soft-tissue]
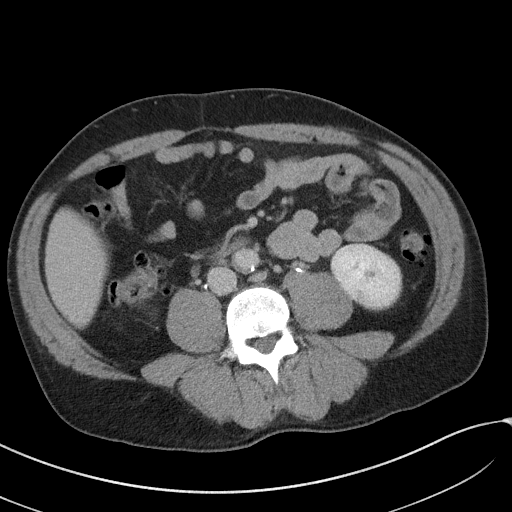
[im 79/101  soft-tissue]
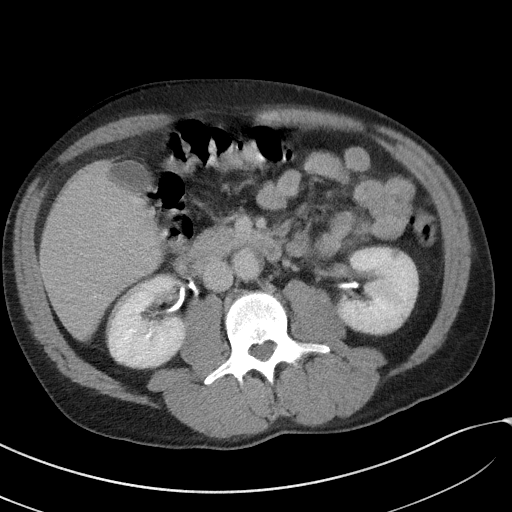
[im 86/101  soft-tissue]
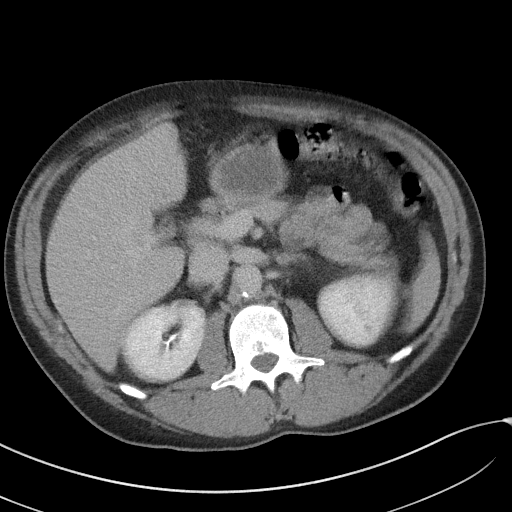
[im 93/101  soft-tissue]
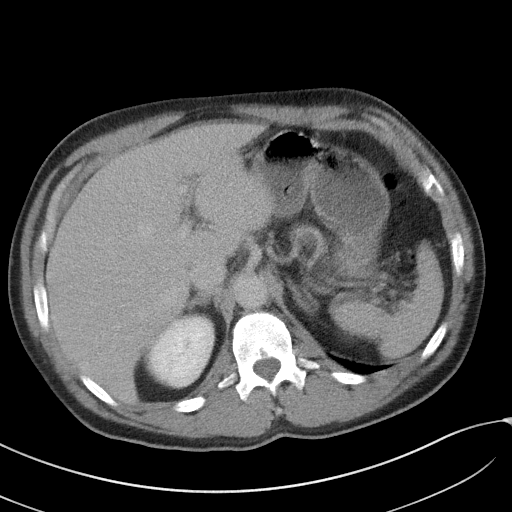

[16 of 46 positions shown; findings below may reference images not displayed]

FINDINGS: Lower chest: The visualized heart size within normal limits. No
pericardial fluid/thickening.

No hiatal hernia.

The visualized portions of the lungs are clear.

Hepatobiliary: The liver is normal in density without focal
abnormality.The main portal vein is patent. No evidence of calcified
gallstones, gallbladder wall thickening or biliary dilatation.

Pancreas: Unremarkable. No pancreatic ductal dilatation or
surrounding inflammatory changes.

Spleen: Normal in size without focal abnormality.

Adrenals/Urinary Tract: Both adrenal glands appear normal. Tiny
bilateral hypodense lesions are seen within both kidneys, likely
statistically renal cysts. No renal or collecting system calculi.
The bladder is partially decompressed with apparent mild diffuse
wall thickening and hyperenhancement.

Stomach/Bowel: The stomach, small bowel, and colon are normal in
appearance. Scattered colonic diverticula are noted. No inflammatory
changes, wall thickening, or obstructive findings.The appendix is
normal.

Vascular/Lymphatic: There are no enlarged mesenteric,
retroperitoneal, or pelvic lymph nodes. Scattered calcified and
noncalcified plaque is noted.

Reproductive: The prostate is unremarkable.

Other: There is diffuse subcutaneous edema seen surrounding the
bilateral testicles with small bilateral hydroceles. There is mild
edema and wall thickening noted within the the perineal soft
tissues. No subcutaneous emphysema is seen.

Musculoskeletal: No acute or significant osseous findings.
IMPRESSION: Findings which could be suggestive of diffuse cellulitis involving
the bilateral scrotum with small bilateral hydroceles. No evidence
of subcutaneous emphysema.

Partially decompressed bladder with findings that could be
suggestive mild cystitis.

Aortic Atherosclerosis (041BR-SQA.A).

## 2023-11-09 ENCOUNTER — Other Ambulatory Visit: Payer: Self-pay

## 2023-11-09 ENCOUNTER — Emergency Department: Payer: Self-pay

## 2023-11-09 ENCOUNTER — Inpatient Hospital Stay
Admission: EM | Admit: 2023-11-09 | Discharge: 2023-11-13 | DRG: 189 | Disposition: A | Discharge status: Home / Self Care | Payer: Self-pay | Attending: Family Medicine | Admitting: Family Medicine

## 2023-11-09 DIAGNOSIS — J440 Chronic obstructive pulmonary disease with acute lower respiratory infection: Secondary | ICD-10-CM | POA: Diagnosis present

## 2023-11-09 DIAGNOSIS — J441 Chronic obstructive pulmonary disease with (acute) exacerbation: Secondary | ICD-10-CM | POA: Diagnosis present

## 2023-11-09 DIAGNOSIS — J9621 Acute and chronic respiratory failure with hypoxia: Secondary | ICD-10-CM | POA: Diagnosis present

## 2023-11-09 DIAGNOSIS — K219 Gastro-esophageal reflux disease without esophagitis: Secondary | ICD-10-CM | POA: Diagnosis present

## 2023-11-09 DIAGNOSIS — Z720 Tobacco use: Secondary | ICD-10-CM | POA: Diagnosis present

## 2023-11-09 DIAGNOSIS — R739 Hyperglycemia, unspecified: Secondary | ICD-10-CM | POA: Diagnosis present

## 2023-11-09 DIAGNOSIS — Z1152 Encounter for screening for COVID-19: Secondary | ICD-10-CM

## 2023-11-09 DIAGNOSIS — I10 Essential (primary) hypertension: Secondary | ICD-10-CM | POA: Diagnosis present

## 2023-11-09 DIAGNOSIS — Z5971 Insufficient health insurance coverage: Secondary | ICD-10-CM | POA: Diagnosis not present

## 2023-11-09 DIAGNOSIS — Z716 Tobacco abuse counseling: Secondary | ICD-10-CM | POA: Diagnosis not present

## 2023-11-09 DIAGNOSIS — E875 Hyperkalemia: Secondary | ICD-10-CM | POA: Diagnosis present

## 2023-11-09 DIAGNOSIS — F17210 Nicotine dependence, cigarettes, uncomplicated: Secondary | ICD-10-CM | POA: Diagnosis present

## 2023-11-09 DIAGNOSIS — J209 Acute bronchitis, unspecified: Secondary | ICD-10-CM | POA: Diagnosis present

## 2023-11-09 DIAGNOSIS — D751 Secondary polycythemia: Secondary | ICD-10-CM | POA: Diagnosis present

## 2023-11-09 DIAGNOSIS — N179 Acute kidney failure, unspecified: Secondary | ICD-10-CM | POA: Diagnosis present

## 2023-11-09 DIAGNOSIS — J9601 Acute respiratory failure with hypoxia: Secondary | ICD-10-CM | POA: Diagnosis present

## 2023-11-09 DIAGNOSIS — D72829 Elevated white blood cell count, unspecified: Secondary | ICD-10-CM | POA: Diagnosis present

## 2023-11-09 LAB — COMPREHENSIVE METABOLIC PANEL WITH GFR
ALT: 24 U/L (ref 0–44)
AST: 25 U/L (ref 15–41)
Albumin: 3.2 g/dL — ABNORMAL LOW (ref 3.5–5.0)
Alkaline Phosphatase: 63 U/L (ref 38–126)
Anion gap: 17 — ABNORMAL HIGH (ref 5–15)
BUN: 11 mg/dL (ref 6–20)
CO2: 29 mmol/L (ref 22–32)
Calcium: 8.7 mg/dL — ABNORMAL LOW (ref 8.9–10.3)
Chloride: 95 mmol/L — ABNORMAL LOW (ref 98–111)
Creatinine, Ser: 1.27 mg/dL — ABNORMAL HIGH (ref 0.61–1.24)
GFR, Estimated: >60 mL/min (ref >60)
Glucose, Bld: 149 mg/dL — ABNORMAL HIGH (ref 70–99)
Potassium: 4 mmol/L (ref 3.5–5.1)
Sodium: 141 mmol/L (ref 135–145)
Total Bilirubin: 1.1 mg/dL (ref 0.0–1.2)
Total Protein: 6.7 g/dL (ref 6.5–8.1)

## 2023-11-09 LAB — RESP PANEL BY RT-PCR (RSV, FLU A&B, COVID)  RVPGX2
Influenza A by PCR: NEGATIVE
Influenza B by PCR: NEGATIVE
Resp Syncytial Virus by PCR: NEGATIVE
SARS Coronavirus 2 by RT PCR: NEGATIVE

## 2023-11-09 LAB — CBC
HCT: 57 % — ABNORMAL HIGH (ref 39.0–52.0)
HCT: 57.7 % — ABNORMAL HIGH (ref 39.0–52.0)
Hemoglobin: 18.3 g/dL — ABNORMAL HIGH (ref 13.0–17.0)
Hemoglobin: 18.8 g/dL — ABNORMAL HIGH (ref 13.0–17.0)
MCH: 30.2 pg (ref 26.0–34.0)
MCH: 30.6 pg (ref 26.0–34.0)
MCHC: 32.1 g/dL (ref 30.0–36.0)
MCHC: 32.6 g/dL (ref 30.0–36.0)
MCV: 94.1 fL (ref 80.0–100.0)
MCV: 94 fL (ref 80.0–100.0)
Platelets: 249 K/uL (ref 150–400)
Platelets: 223 K/uL (ref 150–400)
RBC: 6.06 MIL/uL — ABNORMAL HIGH (ref 4.22–5.81)
RBC: 6.14 MIL/uL — ABNORMAL HIGH (ref 4.22–5.81)
RDW: 12.5 % (ref 11.5–15.5)
RDW: 12.5 % (ref 11.5–15.5)
WBC: 15.2 K/uL — ABNORMAL HIGH (ref 4.0–10.5)
WBC: 11.4 K/uL — ABNORMAL HIGH (ref 4.0–10.5)
nRBC: 0 % (ref 0.0–0.2)
nRBC: 0 % (ref 0.0–0.2)

## 2023-11-09 LAB — CREATININE, SERUM
Creatinine, Ser: 0.96 mg/dL (ref 0.61–1.24)
GFR, Estimated: >60 mL/min (ref >60)

## 2023-11-09 LAB — LACTIC ACID, PLASMA
Lactic Acid, Venous: 1.9 mmol/L (ref 0.5–1.9)
Lactic Acid, Venous: 1.7 mmol/L (ref 0.5–1.9)

## 2023-11-09 LAB — TROPONIN I (HIGH SENSITIVITY): Troponin I (High Sensitivity): 7 ng/L (ref <18)

## 2023-11-09 LAB — BRAIN NATRIURETIC PEPTIDE: B Natriuretic Peptide: 151.8 pg/mL — ABNORMAL HIGH (ref 0.0–100.0)

## 2023-11-09 MED ORDER — ALUM & MAG HYDROXIDE-SIMETH 200-200-20 MG/5ML PO SUSP: 30.0000 mL | Freq: Four times a day (QID) | ORAL | Status: DC | PRN

## 2023-11-09 MED ORDER — IPRATROPIUM-ALBUTEROL 0.5-2.5 (3) MG/3ML IN SOLN
3.0000 mL | RESPIRATORY_TRACT | Status: DC | PRN
  Administered 2023-11-10 – 2023-11-11 (×3): 3 mL via RESPIRATORY_TRACT
  Filled 2023-11-09 (×3): qty 3

## 2023-11-09 MED ORDER — POLYETHYLENE GLYCOL 3350 17 G PO PACK: 17.0000 g | PACK | Freq: Every day | ORAL | Status: DC | PRN

## 2023-11-09 MED ORDER — PREDNISONE 20 MG PO TABS
40.0000 mg | ORAL_TABLET | Freq: Every day | ORAL | Status: AC
  Administered 2023-11-10 – 2023-11-13 (×4): 40 mg via ORAL
  Filled 2023-11-09 (×4): qty 2

## 2023-11-09 MED ORDER — ACETAMINOPHEN 325 MG PO TABS: 650.0000 mg | ORAL_TABLET | Freq: Four times a day (QID) | ORAL | Status: DC | PRN

## 2023-11-09 MED ORDER — LISINOPRIL 10 MG PO TABS
10.0000 mg | ORAL_TABLET | Freq: Every day | ORAL | Status: AC
  Administered 2023-11-09 – 2023-11-10 (×2): 10 mg via ORAL
  Filled 2023-11-09 (×2): qty 1

## 2023-11-09 MED ORDER — SODIUM CHLORIDE 0.9 % IV SOLN: INTRAVENOUS | Status: DC

## 2023-11-09 MED ORDER — PANTOPRAZOLE SODIUM 40 MG IV SOLR
40.0000 mg | Freq: Two times a day (BID) | INTRAVENOUS | Status: DC
  Administered 2023-11-09 – 2023-11-10 (×4): 40 mg via INTRAVENOUS
  Filled 2023-11-09 (×4): qty 10

## 2023-11-09 MED ORDER — ENOXAPARIN SODIUM 40 MG/0.4ML IJ SOSY
40.0000 mg | PREFILLED_SYRINGE | INTRAMUSCULAR | Status: DC
  Administered 2023-11-12: 40 mg via SUBCUTANEOUS
  Filled 2023-11-09 (×4): qty 0.4

## 2023-11-09 MED ORDER — ONDANSETRON HCL 4 MG PO TABS: 4.0000 mg | ORAL_TABLET | Freq: Four times a day (QID) | ORAL | Status: DC | PRN

## 2023-11-09 MED ORDER — DOXYCYCLINE HYCLATE 100 MG PO TABS
100.0000 mg | ORAL_TABLET | Freq: Two times a day (BID) | ORAL | Status: DC
  Administered 2023-11-09 – 2023-11-13 (×9): 100 mg via ORAL
  Filled 2023-11-09 (×9): qty 1

## 2023-11-09 MED ORDER — ACETAMINOPHEN 650 MG RE SUPP: 650.0000 mg | Freq: Four times a day (QID) | RECTAL | Status: DC | PRN

## 2023-11-09 MED ORDER — MORPHINE SULFATE (PF) 2 MG/ML IV SOLN: 2.0000 mg | INTRAVENOUS | Status: DC | PRN

## 2023-11-09 MED ORDER — HYDRALAZINE HCL 20 MG/ML IJ SOLN
10.0000 mg | Freq: Four times a day (QID) | INTRAMUSCULAR | Status: DC | PRN
  Administered 2023-11-10 – 2023-11-11 (×2): 10 mg via INTRAVENOUS
  Filled 2023-11-09 (×2): qty 1

## 2023-11-09 MED ORDER — ONDANSETRON HCL 4 MG/2ML IJ SOLN
4.0000 mg | Freq: Four times a day (QID) | INTRAMUSCULAR | Status: AC | PRN
Start: 2023-11-09 — End: ?

## 2023-11-09 MED ORDER — METHYLPREDNISOLONE SODIUM SUCC 40 MG IJ SOLR
40.0000 mg | Freq: Two times a day (BID) | INTRAMUSCULAR | Status: AC
  Administered 2023-11-09 – 2023-11-10 (×2): 40 mg via INTRAVENOUS
  Filled 2023-11-09 (×2): qty 1

## 2023-11-09 MED ORDER — OXYCODONE HCL 5 MG PO TABS: 5.0000 mg | ORAL_TABLET | ORAL | Status: DC | PRN

## 2023-11-09 NOTE — ED Provider Notes (Signed)
 [facility] Provider Note    Event Date/Time   First MD Initiated Contact with Patient 11/09/23 [phone]     (approximate)   History   Shortness of Breath   HPI  Zachary Joseph is a 58 y.o. male with a history of cigarette smoking who presents with complaints of shortness of breath.  Patient reports he has been feeling increasingly short of breath over the last 2 days but woke up this morning diaphoretic and could not breathe.  He does not think that he has had fevers.  EMS gave Solu-Medrol, 2 DuoNebs and he is feeling improved now however room air saturation is 82%     Physical Exam   Triage Vital Signs: ED Triage Vitals  Encounter Vitals Group     BP --      Girls Systolic BP Percentile --      Girls Diastolic BP Percentile --      Boys Systolic BP Percentile --      Boys Diastolic BP Percentile --      Pulse Rate 11/09/23 0924 92     Resp 11/09/23 0924 13     Temp 11/09/23 0927 98.3 F (36.8 C)     Temp Source 11/09/23 0927 Oral     SpO2 11/09/23 0924 (!) 89 %     Weight --      Height 11/09/23 0926 1.753 m (5' 9)     Head Circumference --      Peak Flow --      Pain Score 11/09/23 0924 0     Pain Loc --      Pain Education --      Exclude from Growth Chart --     Most recent vital signs: Vitals:   11/09/23 0927 11/09/23 1000  BP:  (!) 145/103  Pulse: 89 86  Resp: 19 20  Temp: 98.3 F (36.8 C)   SpO2: 93% 94%     General: Awake,  CV:  Good peripheral perfusion.  Resp:  Mild tachypnea, bilateral wheezing, no rales Abd:  No distention.  Other:  No lower extremity edema or tenderness to palpate   ED Results / Procedures / Treatments   Labs (all labs ordered are listed, but only abnormal results are displayed) Labs Reviewed  CBC - Abnormal; Notable for the following components:      Result Value   WBC 15.2 (*)    RBC 6.06 (*)    Hemoglobin 18.3 (*)    HCT 57.0 (*)    All other components within normal limits   COMPREHENSIVE METABOLIC PANEL WITH GFR - Abnormal; Notable for the following components:   Chloride 95 (*)    Glucose, Bld 149 (*)    Creatinine, Ser 1.27 (*)    Calcium 8.7 (*)    Albumin 3.2 (*)    Anion gap 17 (*)    All other components within normal limits  BRAIN NATRIURETIC PEPTIDE - Abnormal; Notable for the following components:   B Natriuretic Peptide 151.8 (*)    All other components within normal limits  CULTURE, BLOOD (ROUTINE X 2)  CULTURE, BLOOD (ROUTINE X 2)  LACTIC ACID, PLASMA  LACTIC ACID, PLASMA  TROPONIN I (HIGH SENSITIVITY)     EKG  ED ECG REPORT I, Lamar Price, the attending physician, personally viewed and interpreted this ECG.  Date: 11/09/2023   QRS Axis: normal Intervals: normal ST/T Wave abnormalities: Nonspecific changes l Narrative Interpretation: no evidence of acute ischemia  RADIOLOGY Chest x-ray view interpret by me, no evidence of pneumonia, pending radiology review    PROCEDURES:  Critical Care performed: yes  CRITICAL CARE Performed by: Lamar Price   Total critical care time: 30 minutes  Critical care time was exclusive of separately billable procedures and treating other patients.  Critical care was necessary to treat or prevent imminent or life-threatening deterioration.  Critical care was time spent personally by me on the following activities: development of treatment plan with patient and/or surrogate as well as nursing, discussions with consultants, evaluation of patient's response to treatment, examination of patient, obtaining history from patient or surrogate, ordering and performing treatments and interventions, ordering and review of laboratory studies, ordering and review of radiographic studies, pulse oximetry and re-evaluation of patient's condition.   Procedures   MEDICATIONS ORDERED IN ED: Medications - No data to display   IMPRESSION / MDM / ASSESSMENT AND PLAN / ED COURSE  I reviewed the  triage vital signs and the nursing notes. Patient's presentation is most consistent with acute presentation with potential threat to life or bodily function.  Patient presents with shortness of breath as detailed above, differential includes COPD exacerbation, pneumonia.  He does have acute hypoxic respiratory failure, he is requiring nasal cannula oxygen to keep his saturations above 90%.  Will give an additional DuoNeb, obtain labs including blood cultures lactic, chest x-ray and reevaluate.  Chest x-ray with no evidence of pneumonia or pneumothorax, lab work demonstrates elevated white blood cell count but no signs of infection, likely from increased work of breathing.  Still requiring nasal cannula oxygen to keep saturations above 90%.  Will admit to the hospitalist team      FINAL CLINICAL IMPRESSION(S) / ED DIAGNOSES   Final diagnoses:  COPD exacerbation (HCC)  Acute respiratory failure with hypoxia (HCC)     Rx / DC Orders   ED Discharge Orders     None        Note:  This document was prepared using Dragon voice recognition software and may include unintentional dictation errors.   Price Lamar, MD 11/09/23 1055

## 2023-11-09 NOTE — Progress Notes (Signed)
Chart Reviewed.

## 2023-11-09 NOTE — H&P (Signed)
 History and Physical    Zachary Joseph FMW:[id number] DOB: [birth year] DOA: 11/09/2023  DOS: the patient was seen and examined on 11/09/2023  PCP: Patient, No Pcp Per   Patient coming from: Home  I have personally briefly reviewed patient's old medical records in [facility]  Chief Complaint: Shortness of breath  HPI: Zachary Joseph is a pleasant 58 y.o. male with medical history significant for chronic active smoking who presented to the ED complaining of shortness of breath for the last 2 days.  Patient stated that he has been feeling increasingly short of breath over the past 2 days but he woke up this morning with a diaphoretic and could not breathe at all.  He he did not feel that he had a fevers.  However, he complains of a productive cough with yellowish sputum no blood.  He called the EMS who gave him 2 DuoNebs, 125 mg Solu-Medrol and room air saturation was 85%.  He required 3 L nasal cannula to maintain saturation more than 90%.  He denied any chest pain, palpitations, dysuria, hematuria, nausea or vomiting.  ED Course: Upon arrival to the ED, patient is found to be short of breath, leukocytosis at 15.2, BNP 151.8, anion gap of 17, creatinine of 1.27, EKG showed sinus rhythm, chest x-ray showed no pneumonia.  Patient was given nebulization and oxygen and hospitalist service was consulted for evaluation for admission.  Review of Systems:  ROS  All other systems negative except as noted in the HPI.  History reviewed. No pertinent past medical history.  Past Surgical History:  Procedure Laterality Date   HERNIA REPAIR       reports that he has been smoking cigarettes. He has never used smokeless tobacco. He reports current alcohol use. He reports that he does not currently use drugs.  No Known Allergies  History reviewed. No pertinent family history.  Prior to Admission medications   Not on File    Physical Exam: Vitals:   11/09/23 0926 11/09/23 0927 11/09/23 1000  11/09/23 1206  BP:   (!) 145/103   Pulse:  89 86   Resp:  19 20   Temp:  98.3 F (36.8 C)    TempSrc:  Oral    SpO2:  93% 94%   Weight:    72 kg  Height: 5' 9 (1.753 m)   5' 9 (1.753 m)    Physical Exam   Constitutional: Alert, awake, calm, comfortable HEENT: Neck supple Respiratory: Bilateral decreased air entry at the bases, extensive wheezes no rales no rhonchi Cardiovascular: Regular rate and rhythm, no murmurs / rubs / gallops. No extremity edema. 2+ pedal pulses. No carotid bruits.  Abdomen: Soft, no tenderness, Bowel sounds positive.  Musculoskeletal: no clubbing / cyanosis. Good ROM, no contractures. Normal muscle tone.  Skin: no rashes, lesions, ulcers. Neurologic: CN 2-12 grossly intact. Sensation intact, No focal deficit identified Psychiatric: Alert and oriented x 3. Normal mood.    Labs on Admission: I have personally reviewed following labs and imaging studies  CBC: Recent Labs  Lab 11/09/23 0929  WBC 15.2*  HGB 18.3*  HCT 57.0*  MCV 94.1  PLT 249   Basic Metabolic Panel: Recent Labs  Lab 11/09/23 0929  NA 141  K 4.0  CL 95*  CO2 29  GLUCOSE 149*  BUN 11  CREATININE 1.27*  CALCIUM 8.7*   GFR: Estimated Creatinine Clearance: 62.6 mL/min (A) (by C-G formula based on SCr of 1.27 mg/dL (H)). Liver Function Tests:  Recent Labs  Lab 11/09/23 0929  AST 25  ALT 24  ALKPHOS 63  BILITOT 1.1  PROT 6.7  ALBUMIN 3.2*   No results for input(s): LIPASE, AMYLASE in the last 168 hours. No results for input(s): AMMONIA in the last 168 hours. Coagulation Profile: No results for input(s): INR, PROTIME in the last 168 hours. Cardiac Enzymes: Recent Labs  Lab 11/09/23 0929  TROPONINIHS 7   BNP (last 3 results) Recent Labs    11/09/23 0929  BNP 151.8*   HbA1C: No results for input(s): HGBA1C in the last 72 hours. CBG: No results for input(s): GLUCAP in the last 168 hours. Lipid Profile: No results for input(s): CHOL, HDL,  LDLCALC, TRIG, CHOLHDL, LDLDIRECT in the last 72 hours. Thyroid Function Tests: No results for input(s): TSH, T4TOTAL, FREET4, T3FREE, THYROIDAB in the last 72 hours. Anemia Panel: No results for input(s): VITAMINB12, FOLATE, FERRITIN, TIBC, IRON, RETICCTPCT in the last 72 hours. Urine analysis:    Component Value Date/Time   COLORURINE AMBER (A) 08/24/2019 1242   APPEARANCEUR Cloudy (A) 10/04/2019 1452   LABSPEC 1.019 08/24/2019 1242   PHURINE 5.0 08/24/2019 1242   GLUCOSEU Negative 10/04/2019 1452   HGBUR LARGE (A) 08/24/2019 1242   BILIRUBINUR Negative 10/04/2019 1452   KETONESUR NEGATIVE 08/24/2019 1242   PROTEINUR Trace (A) 10/04/2019 1452   PROTEINUR 100 (A) 08/24/2019 1242   NITRITE Negative 10/04/2019 1452   NITRITE NEGATIVE 08/24/2019 1242   LEUKOCYTESUR Negative 10/04/2019 1452   LEUKOCYTESUR LARGE (A) 08/24/2019 1242    Radiological Exams on Admission: I have personally reviewed images DG Chest 2 View Result Date: 11/09/2023 CLINICAL DATA:  Shortness of breath. EXAM: CHEST - 2 VIEW COMPARISON:  None Available. FINDINGS: The heart size and mediastinal contours are within normal limits. Mild atelectatic changes are suspected within the bilateral lung bases. No acute infiltrate, pleural effusion or pneumothorax is identified. The visualized skeletal structures are unremarkable. IMPRESSION: No active cardiopulmonary disease. Electronically Signed   By: Suzen Dials M.D.   On: 11/09/2023 10:32    EKG: My personal interpretation of EKG shows: Sinus rhythm with multiple motion artifact, no obvious ST elevation    Assessment/Plan Principal Problem:   COPD exacerbation (HCC) Active Problems:   Leucocytosis   Tobacco abuse   Acute hypoxemic respiratory failure (HCC)   AKI (acute kidney injury)    Assessment and Plan: 58 year old male with history of chronic active smoking not on any medication at home who came into ED complaining of  shortness of breath and productive sputum for the last 2 days.  1.  Acute hypoxemic respiratory failure likely due to COPD exacerbation and acute bronchitis - He will be admitted to the hospital as inpatient - His room air saturation was 85% and required 3 L of nasal cannula oxygen to maintain.  Reasonable limit present - Continue oxygen to maintain saturation more than 90% and try to wean off as much as we can - Solu-Medrol 40 mg twice daily and transition to p.o. 8 steroid, nebulization oxygen - It appears that he has bronchitis with productive sputum he will be continued on doxycycline  2.  AKI - He has history of anemia - With his hemoglobin today is 18 hematocrit is 57 - You could be severely dehydrated - Will give him hydration with normal saline  3.  Leukocytosis - I reviewed the history and he had a leukocytosis in the past as well. - Blood cultures were sent from the emergency room. - Will  continue to monitor leukocytosis  4.  Hyperglycemia - Does not carry the diagnosis of diabetes - Will check A1c  5.  HTN - Patient does not have a diagnosis of hypertension - Blood pressure is high - I will place him on lisinopril 10 mg and hydralazine as needed for systolic blood pressure more than 160.  6.  GERD/GI prophylaxis - He will be given Protonix IV and Maalox while he is in the hospital and when he is getting his steroid     DVT prophylaxis: Lovenox  Code Status: Full Code Family Communication: None Disposition Plan: Home Consults called: None Admission status: Inpatient, Telemetry bed   Nena Rebel, MD [facility] 11/09/2023, 12:13 PM

## 2023-11-09 NOTE — ED Triage Notes (Signed)
 Pt to ED via [facility] from work for c/o SOB. Pt states he has had a productive cough and sob for two days.  Pt states he was sweaty this morning and sob was worse.  Pt given two duo nebs and 125 solumedrol by EMS.  Pt 85% on room air on arrival, placed on 3L nasal cannula

## 2023-11-10 LAB — COMPREHENSIVE METABOLIC PANEL WITH GFR
ALT: 19 U/L (ref 0–44)
AST: 19 U/L (ref 15–41)
Albumin: 2.9 g/dL — ABNORMAL LOW (ref 3.5–5.0)
Alkaline Phosphatase: 52 U/L (ref 38–126)
Anion gap: 15 (ref 5–15)
BUN: 15 mg/dL (ref 6–20)
CO2: 26 mmol/L (ref 22–32)
Calcium: 8.7 mg/dL — ABNORMAL LOW (ref 8.9–10.3)
Chloride: 100 mmol/L (ref 98–111)
Creatinine, Ser: 0.98 mg/dL (ref 0.61–1.24)
GFR, Estimated: >60 mL/min (ref >60)
Glucose, Bld: 130 mg/dL — ABNORMAL HIGH (ref 70–99)
Potassium: 5.3 mmol/L — ABNORMAL HIGH (ref 3.5–5.1)
Sodium: 141 mmol/L (ref 135–145)
Total Bilirubin: 0.9 mg/dL (ref 0.0–1.2)
Total Protein: 6 g/dL — ABNORMAL LOW (ref 6.5–8.1)

## 2023-11-10 LAB — CBC
HCT: 55.4 % — ABNORMAL HIGH (ref 39.0–52.0)
Hemoglobin: 18 g/dL — ABNORMAL HIGH (ref 13.0–17.0)
MCH: 30.4 pg (ref 26.0–34.0)
MCHC: 32.5 g/dL (ref 30.0–36.0)
MCV: 93.4 fL (ref 80.0–100.0)
Platelets: 230 K/uL (ref 150–400)
RBC: 5.93 MIL/uL — ABNORMAL HIGH (ref 4.22–5.81)
RDW: 12.2 % (ref 11.5–15.5)
WBC: 11.3 K/uL — ABNORMAL HIGH (ref 4.0–10.5)
nRBC: 0 % (ref 0.0–0.2)

## 2023-11-10 LAB — PROTIME-INR
INR: 1.2 (ref 0.8–1.2)
Prothrombin Time: 16 s — ABNORMAL HIGH (ref 11.4–15.2)

## 2023-11-10 LAB — HEMOGLOBIN A1C
Hgb A1c MFr Bld: 5.1 % (ref 4.8–5.6)
Mean Plasma Glucose: 99.67 mg/dL

## 2023-11-10 LAB — HIV ANTIBODY (ROUTINE TESTING W REFLEX): HIV Screen 4th Generation wRfx: NONREACTIVE

## 2023-11-10 LAB — MAGNESIUM: Magnesium: 1.8 mg/dL (ref 1.7–2.4)

## 2023-11-10 MED ORDER — IPRATROPIUM-ALBUTEROL 0.5-2.5 (3) MG/3ML IN SOLN
3.0000 mL | Freq: Four times a day (QID) | RESPIRATORY_TRACT | Status: DC
  Filled 2023-11-10: qty 3

## 2023-11-10 MED ORDER — SODIUM ZIRCONIUM CYCLOSILICATE 10 G PO PACK
10.0000 g | PACK | Freq: Once | ORAL | Status: AC
  Administered 2023-11-10: 10 g via ORAL
  Filled 2023-11-10: qty 1

## 2023-11-10 NOTE — Plan of Care (Signed)

## 2023-11-10 NOTE — Progress Notes (Signed)
 The patient's IV to his Left AC infiltrated when his IV fluids 0.9% sodium chloride  at 100ML/HR was started. This writer removed the IV. The patient has an IV to his RFA, his sodium chloride  was started in that IV site w/o any difficulties.

## 2023-11-10 NOTE — Progress Notes (Signed)
 CCMD called and told this writer that the patient had a nine run of V Tach. This Clinical research associate notified Dr. Delayne Solian, ordered a magnesium level.

## 2023-11-10 NOTE — Progress Notes (Signed)
 Brief Assessment Note  Medical record reviewed. Patient has not PCP on record, list of PCPs added to the patient's AVS. Also, no payor noted. Referral sent to Vernell Gully with finance to screen for Medicaid.   Please outreach to [facility] if needs are identified.

## 2023-11-10 NOTE — Progress Notes (Signed)
  PROGRESS NOTE    Zachary Joseph  FMW:[id number] DOB: 21-Mar-1965 DOA: 11/09/2023 PCP: Patient, No Pcp Per  108A/108A-AA  LOS: 1 day   Brief hospital course:   Assessment & Plan: Zachary Joseph is a pleasant 58 y.o. male with medical history significant for chronic active smoking who presented to the ED complaining of shortness of breath for the last 2 days.    He complained of a productive cough with yellowish sputum no blood.  He called the EMS who gave him 2 DuoNebs, 125 mg Solu-Medrol and room air saturation was 85%.  He required 3 L nasal cannula to maintain saturation more than 90%.    1.  Acute hypoxemic respiratory failure due to COPD exacerbation and acute bronchitis - His room air saturation was 85% and required 3 L of nasal cannula oxygen to maintain.   --started on steroid and doxy --cont prednisone 40 mg daily --cont doxy --DuoNeb --Continue supplemental O2 to keep sats >=90%, wean as tolerated   2.  AKI --Cr 1.27 on presentation, improved with IVF. --oral hydration now   3.  Leukocytosis, chronic --CXR showed no sign of PNA. --monitor   5.  Elevated BP - Patient does not have a diagnosis of hypertension --started on Lisinopril 10 mg daily on admission   Hyperkalemia, mild --K 5.3 today.   --lokelma 10g x1    DVT prophylaxis: Lovenox  SQ Code Status: Full code  Family Communication:  Level of care: Telemetry Medical Dispo:   The patient is from: home Anticipated d/c is to: home Anticipated d/c date is: 1-2 days   Subjective and Interval History:  Pt reported breathing had not improved.  Reported cough with sputum production.   Objective: Vitals:   11/10/23 1024 11/10/23 1148 11/10/23 1652 11/10/23 1935  BP:  (!) 137/94 (!) 142/83 (!) 157/83  Pulse:  97 74 85  Resp:  19 18   Temp:  97.8 F (36.6 C) 98.3 F (36.8 C) 97.8 F (36.6 C)  TempSrc:  Oral Oral   SpO2: 94% 95% 94% 94%  Weight:      Height:        Intake/Output Summary (Last 24  hours) at 11/10/2023 2149 Last data filed at 11/10/2023 1900 Gross per 24 hour  Intake 2081.19 ml  Output --  Net 2081.19 ml   Filed Weights   11/09/23 1206  Weight: 72 kg    Examination:   Constitutional: NAD, AAOx3 HEENT: conjunctivae and lids normal, EOMI CV: No cyanosis.   RESP: normal respiratory effort, mild wheezes, reduced lung sounds, on 2L Neuro: II - XII grossly intact.     Data Reviewed: I have personally reviewed labs and imaging studies  Time spent: 50 minutes  Ellouise Haber, MD [facility] If 7PM-7AM, please contact night-coverage 11/10/2023, 9:49 PM

## 2023-11-10 NOTE — Plan of Care (Signed)
  Problem: Education: Goal: Knowledge of General Education information will improve Description: Including pain rating scale, medication(s)/side effects and non-pharmacologic comfort measures Outcome: Progressing   Problem: Nutrition: Goal: Adequate nutrition will be maintained Outcome: Progressing   Problem: Coping: Goal: Level of anxiety will decrease Outcome: Progressing   Problem: Pain Managment: Goal: General experience of comfort will improve and/or be controlled Outcome: Progressing   Problem: Safety: Goal: Ability to remain free from injury will improve Outcome: Progressing   Problem: Education: Goal: Knowledge of disease or condition will improve Outcome: Progressing Goal: Knowledge of the prescribed therapeutic regimen will improve Outcome: Progressing

## 2023-11-10 NOTE — Discharge Instructions (Signed)
 Some PCP options in [place] area- not a comprehensive list  [facility]- [phone] [facility]- [phone] [facility]- [phone] [facility]- [phone] [facility]- [phone] Nichole Molly- [phone]  or [facility] Physician Referral Line [phone]

## 2023-11-11 DIAGNOSIS — Z720 Tobacco use: Secondary | ICD-10-CM

## 2023-11-11 DIAGNOSIS — D72829 Elevated white blood cell count, unspecified: Secondary | ICD-10-CM

## 2023-11-11 LAB — TECHNOLOGIST SMEAR REVIEW: Plt Morphology: NORMAL

## 2023-11-11 LAB — POTASSIUM: Potassium: 3.9 mmol/L (ref 3.5–5.1)

## 2023-11-11 MED ORDER — HYDROCOD POLI-CHLORPHE POLI ER 10-8 MG/5ML PO SUER
5.0000 mL | Freq: Two times a day (BID) | ORAL | Status: AC | PRN
Start: 2023-11-11 — End: ?
  Administered 2023-11-12 – 2023-11-13 (×2): 5 mL via ORAL
  Filled 2023-11-11 (×2): qty 5

## 2023-11-11 MED ORDER — GUAIFENESIN ER 600 MG PO TB12
600.0000 mg | ORAL_TABLET | Freq: Two times a day (BID) | ORAL | Status: DC
  Administered 2023-11-11 – 2023-11-13 (×4): 600 mg via ORAL
  Filled 2023-11-11 (×4): qty 1

## 2023-11-11 MED ORDER — IPRATROPIUM-ALBUTEROL 0.5-2.5 (3) MG/3ML IN SOLN
3.0000 mL | Freq: Four times a day (QID) | RESPIRATORY_TRACT | Status: DC
  Administered 2023-11-11 – 2023-11-13 (×10): 3 mL via RESPIRATORY_TRACT
  Filled 2023-11-11 (×10): qty 3

## 2023-11-11 MED ORDER — ALBUTEROL SULFATE (2.5 MG/3ML) 0.083% IN NEBU
INHALATION_SOLUTION | RESPIRATORY_TRACT | Status: AC
  Administered 2023-11-11: 2.5 mg
  Filled 2023-11-11: qty 3

## 2023-11-11 MED ORDER — ENSURE PLUS HIGH PROTEIN PO LIQD
237.0000 mL | Freq: Two times a day (BID) | ORAL | Status: DC
  Administered 2023-11-11 – 2023-11-13 (×6): 237 mL via ORAL

## 2023-11-11 MED ORDER — ALBUTEROL SULFATE (2.5 MG/3ML) 0.083% IN NEBU: 2.5000 mg | INHALATION_SOLUTION | RESPIRATORY_TRACT | Status: DC | PRN

## 2023-11-11 NOTE — Progress Notes (Signed)
 Patient refused LOVENOX  injection.  Hospitalist Mansy,MD was notified.

## 2023-11-11 NOTE — TOC Progression Note (Signed)
 Transition of Care [facility]) - Progression Note    Patient Details  Name: Zachary Joseph MRN: [id number] Date of Birth: 26-Dec-1965  Transition of [facility]) CM/SW Contact  Dalia GORMAN Fuse, RN Phone Number: 11/11/2023, 9:19 AM  Clinical Narrative:    Per Vernell with finance. Patients without insurance we refer to [employer]. Requested [place] to meet with patient to screen for a Medicaid program.                      Expected Discharge Plan and Services                                               Social Drivers of Health (SDOH) Interventions SDOH Screenings   Food Insecurity: No Food Insecurity (11/10/2023)  Housing: Low Risk  (11/10/2023)  Transportation Needs: No Transportation Needs (11/10/2023)  Utilities: Not At Risk (11/10/2023)  Social Connections: Socially Isolated (11/10/2023)  Tobacco Use: High Risk (11/09/2023)    Readmission Risk Interventions     No data to display

## 2023-11-11 NOTE — Progress Notes (Signed)
 PROGRESS NOTE    Zachary Joseph  FMW:[id number] DOB: Aug 13, 1965 DOA: 11/09/2023 PCP: Patient, No Pcp Per  Chief Complaint  Patient presents with   Shortness of Breath    Hospital Course:  Zachary Joseph is a 58 year old male with tobacco abuse, COPD, who presents to the ED with acute shortness of breath.  He was admitted for COPD exacerbation and acute hypoxic respiratory failure.  He was started on steroids, doxycycline, and supplemental O2.  He was also found to have an AKI which is resolving with fluids.  Subjective: This morning patient reports he still very dyspneic on exertion.   Objective: Vitals:   11/11/23 0503 11/11/23 0515 11/11/23 0731 11/11/23 0818  BP:  (!) 163/104  (!) 154/81  Pulse:  80  84  Resp:    18  Temp:    98 F (36.7 C)  TempSrc:      SpO2: 100% 94% 93% 93%  Weight:      Height:        Intake/Output Summary (Last 24 hours) at 11/11/2023 1541 Last data filed at 11/10/2023 1900 Gross per 24 hour  Intake 240 ml  Output --  Net 240 ml   Filed Weights   11/09/23 1206  Weight: 72 kg    Examination: General exam: Appears calm and comfortable, NAD.  Skin with generalized rubor. Respiratory system: Tachypnea, poor aeration bilaterally.  On 2 L [place] Cardiovascular system: S1 & S2 heard, RRR.  Gastrointestinal system: Abdomen is nondistended, soft and nontender.  Neuro: Alert and oriented. No focal neurological deficits.  Assessment & Plan:  Principal Problem:   COPD exacerbation (HCC) Active Problems:   Leucocytosis   Tobacco abuse   Acute hypoxemic respiratory failure (HCC)   AKI (acute kidney injury)    COPD exacerbation Acute hypoxic respiratory failure Acute bronchitis - Initially 85% on room air, currently on 2 to 3 L [place], still tachypneic. - Continue with steroid and Doxy - Continue with DuoNebs - Keep supplemental O2, sat 89 to 91% - Continue to wean to room air as able.  If not we may require oxygen at baseline. - RVP  negative.  AKI - Creatinine 1.27 on arrival - Trend CMP - Improvement with IV fluids  Hyperkalemia - Resolved - Repeat CMP in a.m.  Chronic tobacco abuse - Smoking 1.5 packs/day.  He denies need for nicotine patch at this time - Has been counseled on smoking cessation  Erythrocytosis - Baseline hemoglobin appears to be WNL 20yrs ago but on this admission has been persistently above 18 - This may be secondary to chronic hypoxia given COPD and daily tobacco abuse - Will obtain peripheral smear for better evaluation - PTT mildly elevated, INR 1.2 - Patient denies history of testosterone use  Elevated blood pressure without history of hypertension - Has been initiated on lisinopril.  Monitor for response.  Titrate as needed   DVT prophylaxis: Lovenox    Code Status: Full Code Disposition:  Inpatient pending clinical improvement  Consultants:    Procedures:    Antimicrobials:  Anti-infectives (From admission, onward)    Start     Dose/Rate Route Frequency Ordered Stop   11/09/23 1200  doxycycline (VIBRA-TABS) tablet 100 mg        100 mg Oral Every 12 hours 11/09/23 1148 11/14/23 0959       Data Reviewed: I have personally reviewed following labs and imaging studies CBC: Recent Labs  Lab 11/09/23 0929 11/09/23 1320 11/10/23 0532  WBC 15.2* 11.4* 11.3*  HGB 18.3* 18.8* 18.0*  HCT 57.0* 57.7* 55.4*  MCV 94.1 94.0 93.4  PLT 249 223 230   Basic Metabolic Panel: Recent Labs  Lab 11/09/23 0929 11/09/23 1320 11/10/23 0528 11/10/23 0532 11/11/23 0529  NA 141  --   --  141  --   K 4.0  --   --  5.3* 3.9  CL 95*  --   --  100  --   CO2 29  --   --  26  --   GLUCOSE 149*  --   --  130*  --   BUN 11  --   --  15  --   CREATININE 1.27* 0.96  --  0.98  --   CALCIUM 8.7*  --   --  8.7*  --   MG  --   --  1.8  --   --    GFR: Estimated Creatinine Clearance: 81.2 mL/min (by C-G formula based on SCr of 0.98 mg/dL). Liver Function Tests: Recent Labs  Lab  11/09/23 0929 11/10/23 0532  AST 25 19  ALT 24 19  ALKPHOS 63 52  BILITOT 1.1 0.9  PROT 6.7 6.0*  ALBUMIN 3.2* 2.9*   CBG: No results for input(s): GLUCAP in the last 168 hours.  Recent Results (from the past 240 hours)  Blood culture (routine x 2)     Status: None (Preliminary result)   Collection Time: 11/09/23  9:29 AM   Specimen: BLOOD  Result Value Ref Range Status   Specimen Description BLOOD BLOOD RIGHT ARM  Final   Special Requests   Final    BOTTLES DRAWN AEROBIC AND ANAEROBIC Blood Culture results may not be optimal due to an inadequate volume of blood received in culture bottles   Culture   Final    NO GROWTH 2 DAYS Performed at [facility], [address], [place], [place] [address]    Report Status PENDING  Incomplete  Blood culture (routine x 2)     Status: None (Preliminary result)   Collection Time: 11/09/23  9:33 AM   Specimen: BLOOD  Result Value Ref Range Status   Specimen Description BLOOD BLOOD LEFT ARM  Final   Special Requests   Final    BOTTLES DRAWN AEROBIC AND ANAEROBIC Blood Culture results may not be optimal due to an inadequate volume of blood received in culture bottles   Culture   Final    NO GROWTH 2 DAYS Performed at [facility], [address] [place], [place] [address]    Report Status PENDING  Incomplete  Resp panel by RT-PCR (RSV, Flu A&B, Covid) Anterior Nasal Swab     Status: None   Collection Time: 11/09/23 11:04 AM   Specimen: Anterior Nasal Swab  Result Value Ref Range Status   SARS Coronavirus 2 by RT PCR NEGATIVE NEGATIVE Final    Comment: (NOTE) SARS-CoV-2 target nucleic acids are NOT DETECTED.  The SARS-CoV-2 RNA is generally detectable in upper respiratory specimens during the acute phase of infection. The lowest concentration of SARS-CoV-2 viral copies this assay can detect is 138 copies/mL. A negative result does not preclude SARS-Cov-2 infection and should not be used as the sole basis for  treatment or other patient management decisions. A negative result may occur with  improper specimen collection/handling, submission of specimen other than nasopharyngeal swab, presence of viral mutation(s) within the areas targeted by this assay, and inadequate number of viral copies(<138 copies/mL). A negative result must be combined  with clinical observations, patient history, and epidemiological information. The expected result is Negative.  Fact Sheet for Patients:  BloggerCourse.com  Fact Sheet for Healthcare Providers:  SeriousBroker.it  This test is no t yet approved or cleared by the [place]  FDA and  has been authorized for detection and/or diagnosis of SARS-CoV-2 by FDA under an Emergency Use Authorization (EUA). This EUA will remain  in effect (meaning this test can be used) for the duration of the COVID-19 declaration under Section 564(b)(1) of the Act, 21 U.S.C.section 360bbb-3(b)(1), unless the authorization is terminated  or revoked sooner.       Influenza A by PCR NEGATIVE NEGATIVE Final   Influenza B by PCR NEGATIVE NEGATIVE Final    Comment: (NOTE) The Xpert Xpress SARS-CoV-2/FLU/RSV plus assay is intended as an aid in the diagnosis of influenza from Nasopharyngeal swab specimens and should not be used as a sole basis for treatment. Nasal washings and aspirates are unacceptable for Xpert Xpress SARS-CoV-2/FLU/RSV testing.  Fact Sheet for Patients: BloggerCourse.com  Fact Sheet for Healthcare Providers: SeriousBroker.it  This test is not yet approved or cleared by the [place]  FDA and has been authorized for detection and/or diagnosis of SARS-CoV-2 by FDA under an Emergency Use Authorization (EUA). This EUA will remain in effect (meaning this test can be used) for the duration of the COVID-19 declaration under Section 564(b)(1) of the Act, 21  U.S.C. section 360bbb-3(b)(1), unless the authorization is terminated or revoked.     Resp Syncytial Virus by PCR NEGATIVE NEGATIVE Final    Comment: (NOTE) Fact Sheet for Patients: BloggerCourse.com  Fact Sheet for Healthcare Providers: SeriousBroker.it  This test is not yet approved or cleared by the [place]  FDA and has been authorized for detection and/or diagnosis of SARS-CoV-2 by FDA under an Emergency Use Authorization (EUA). This EUA will remain in effect (meaning this test can be used) for the duration of the COVID-19 declaration under Section 564(b)(1) of the Act, 21 U.S.C. section 360bbb-3(b)(1), unless the authorization is terminated or revoked.  Performed at [facility], [address], [place], [place] [address]      Radiology Studies: No results found.  Scheduled Meds:  doxycycline  100 mg Oral Q12H   enoxaparin  (LOVENOX ) injection  40 mg Subcutaneous Q24H   feeding supplement  237 mL Oral BID BM   ipratropium-albuterol  3 mL Nebulization QID   predniSONE  40 mg Oral Q breakfast   Continuous Infusions:   LOS: 2 days  MDM: Patient is high risk for one or more organ failure.  They necessitate ongoing hospitalization for continued IV therapies and subsequent lab monitoring. Total time spent interpreting labs and vitals, reviewing the medical record, coordinating care amongst consultants and care team members, directly assessing and discussing care with the patient and/or family: 55 min Kaevon Cotta, DO [facility]  To contact the attending physician between 7A-7P please use Epic Chat. To contact the covering physician during after hours 7P-7A, please review Amion.  11/11/2023, 3:41 PM   *This document has been created with the assistance of dictation software. Please excuse typographical errors. *

## 2023-11-11 NOTE — Plan of Care (Signed)

## 2023-11-11 NOTE — Plan of Care (Signed)
  Problem: Pain Managment: Goal: General experience of comfort will improve and/or be controlled Outcome: Progressing   Problem: Safety: Goal: Ability to remain free from injury will improve Outcome: Progressing

## 2023-11-12 LAB — COMPREHENSIVE METABOLIC PANEL WITH GFR
ALT: 19 U/L (ref 0–44)
AST: 21 U/L (ref 15–41)
Albumin: 2.8 g/dL — ABNORMAL LOW (ref 3.5–5.0)
Alkaline Phosphatase: 55 U/L (ref 38–126)
Anion gap: 12 (ref 5–15)
BUN: 16 mg/dL (ref 6–20)
CO2: 33 mmol/L — ABNORMAL HIGH (ref 22–32)
Calcium: 8.7 mg/dL — ABNORMAL LOW (ref 8.9–10.3)
Chloride: 97 mmol/L — ABNORMAL LOW (ref 98–111)
Creatinine, Ser: 0.7 mg/dL (ref 0.61–1.24)
GFR, Estimated: >60 mL/min (ref >60)
Glucose, Bld: 92 mg/dL (ref 70–99)
Potassium: 4 mmol/L (ref 3.5–5.1)
Sodium: 142 mmol/L (ref 135–145)
Total Bilirubin: 1.6 mg/dL — ABNORMAL HIGH (ref 0.0–1.2)
Total Protein: 6 g/dL — ABNORMAL LOW (ref 6.5–8.1)

## 2023-11-12 LAB — CBC WITH DIFFERENTIAL/PLATELET
Abs Immature Granulocytes: 0.06 K/uL (ref 0.00–0.07)
Basophils Absolute: 0 K/uL (ref 0.0–0.1)
Basophils Relative: 0 %
Eosinophils Absolute: 0 K/uL (ref 0.0–0.5)
Eosinophils Relative: 0 %
HCT: 54.6 % — ABNORMAL HIGH (ref 39.0–52.0)
Hemoglobin: 17.1 g/dL — ABNORMAL HIGH (ref 13.0–17.0)
Immature Granulocytes: 1 %
Lymphocytes Relative: 21 %
Lymphs Abs: 2.7 K/uL (ref 0.7–4.0)
MCH: 29.9 pg (ref 26.0–34.0)
MCHC: 31.3 g/dL (ref 30.0–36.0)
MCV: 95.6 fL (ref 80.0–100.0)
Monocytes Absolute: 1.2 K/uL — ABNORMAL HIGH (ref 0.1–1.0)
Monocytes Relative: 10 %
Neutro Abs: 8.9 K/uL — ABNORMAL HIGH (ref 1.7–7.7)
Neutrophils Relative %: 68 %
Platelets: 248 K/uL (ref 150–400)
RBC: 5.71 MIL/uL (ref 4.22–5.81)
RDW: 12.2 % (ref 11.5–15.5)
WBC: 12.9 K/uL — ABNORMAL HIGH (ref 4.0–10.5)
nRBC: 0 % (ref 0.0–0.2)

## 2023-11-12 LAB — PHOSPHORUS: Phosphorus: 2.4 mg/dL — ABNORMAL LOW (ref 2.5–4.6)

## 2023-11-12 LAB — MAGNESIUM: Magnesium: 2 mg/dL (ref 1.7–2.4)

## 2023-11-12 MED ORDER — ORAL CARE MOUTH RINSE: 15.0000 mL | OROMUCOSAL | Status: DC | PRN

## 2023-11-12 MED ORDER — K PHOS MONO-SOD PHOS DI & MONO 155-852-130 MG PO TABS: 500.0000 mg | ORAL_TABLET | ORAL | Status: DC

## 2023-11-12 MED ORDER — K PHOS MONO-SOD PHOS DI & MONO 155-852-130 MG PO TABS
500.0000 mg | ORAL_TABLET | ORAL | Status: AC
  Administered 2023-11-12 (×3): 500 mg via ORAL
  Filled 2023-11-12 (×3): qty 2

## 2023-11-12 NOTE — Plan of Care (Signed)

## 2023-11-12 NOTE — Progress Notes (Signed)
 PROGRESS NOTE    TORRIS HOUSE  FMW:[id number] DOB: [birth year] DOA: 11/09/2023 PCP: Patient, No Pcp Per  Chief Complaint  Patient presents with   Shortness of Breath    Hospital Course:  DOMENIQUE SOUTHERS is a 58 year old male with tobacco abuse, COPD, who presents to the ED with acute shortness of breath.  He was admitted for COPD exacerbation and acute hypoxic respiratory failure.  He was started on steroids, doxycycline, and supplemental O2.  He was also found to have an AKI which is resolving with fluids.  Subjective: He is receiving breathing treatment this a.m.  He endorses ongoing dyspnea.  Still requiring O2.  Objective: Vitals:   11/11/23 2007 11/11/23 2015 11/12/23 0401 11/12/23 0755  BP: (!) 162/87  (!) 149/94 (!) 153/97  Pulse: 75  89 72  Resp: 18  20 18   Temp: 98.6 F (37 C)  98.6 F (37 C) 97.7 F (36.5 C)  TempSrc:   Oral   SpO2: 94% 92% 94% 92%  Weight:      Height:        Intake/Output Summary (Last 24 hours) at 11/12/2023 1437 Last data filed at 11/12/2023 1300 Gross per 24 hour  Intake 120 ml  Output --  Net 120 ml   Filed Weights   11/09/23 1206  Weight: 72 kg    Examination: General exam: Appears calm and comfortable, NAD.  Skin with generalized rubor. Respiratory system: Tachypneic, receiving breathing treatment.  Better aeration today, rhonchi bilaterally Cardiovascular system: S1 & S2 heard, RRR.  Gastrointestinal system: Abdomen is nondistended, soft and nontender.  Neuro: Alert and oriented. No focal neurological deficits.  Assessment & Plan:  Principal Problem:   COPD exacerbation (HCC) Active Problems:   Leucocytosis   Tobacco abuse   Acute hypoxemic respiratory failure (HCC)   AKI (acute kidney injury)    COPD exacerbation Acute hypoxic respiratory failure Acute bronchitis - Initially 85% on room air, currently on 2 to 3 L [place].  Still dyspneic today - Continue with steroid and Doxy - Continue with DuoNeb treatments - Keep  supplemental O2, sat 89 to 91% - Continue to wean to room air as able.  Given patient's extensive tobacco abuse history he may require chronic O2.  Will perform walk trial today - Instructed on incentive spirometry and flutter valve. - RVP negative.  AKI - Creatinine 1.27 on arrival - Creatinine has now improved down to 0.7. - Discontinue further IV fluids.  Encourage p.o. intake.  Hyperkalemia - Resolved  Hypophosphatemia - Replace  Chronic tobacco abuse - Smoking 1.5 packs/day.  He denies need for nicotine patch at this time.  Encouraged him to ask if needed. - Has been counseled on smoking cessation  Erythrocytosis - Baseline hemoglobin appears to be WNL 68yrs ago but on this admission has been persistently above 18 - This may be secondary to chronic hypoxia given COPD and daily tobacco abuse - Peripheral smear reveals unremarkable RBC morphology. - PTT mildly elevated, INR 1.2 - Patient denies history of testosterone use - Hematology referral at DC  Elevated blood pressure without history of hypertension - Has been initiated on lisinopril.  Remains above goal.  Increase lisinopril - Monitor for response and continue to titrate as needed   DVT prophylaxis: Lovenox    Code Status: Full Code Disposition:  Inpatient pending clinical improvement, hopefully DC within next 48 hours  Consultants:    Procedures:    Antimicrobials:  Anti-infectives (From admission, onward)    Start  Dose/Rate Route Frequency Ordered Stop   11/09/23 1200  doxycycline (VIBRA-TABS) tablet 100 mg        100 mg Oral Every 12 hours 11/09/23 1148 11/14/23 0959       Data Reviewed: I have personally reviewed following labs and imaging studies CBC: Recent Labs  Lab 11/09/23 0929 11/09/23 1320 11/10/23 0532 11/12/23 0412  WBC 15.2* 11.4* 11.3* 12.9*  NEUTROABS  --   --   --  8.9*  HGB 18.3* 18.8* 18.0* 17.1*  HCT 57.0* 57.7* 55.4* 54.6*  MCV 94.1 94.0 93.4 95.6  PLT 249 223 230 248    Basic Metabolic Panel: Recent Labs  Lab 11/09/23 0929 11/09/23 1320 11/10/23 0528 11/10/23 0532 11/11/23 0529 11/12/23 0412  NA 141  --   --  141  --  142  K 4.0  --   --  5.3* 3.9 4.0  CL 95*  --   --  100  --  97*  CO2 29  --   --  26  --  33*  GLUCOSE 149*  --   --  130*  --  92  BUN 11  --   --  15  --  16  CREATININE 1.27* 0.96  --  0.98  --  0.70  CALCIUM 8.7*  --   --  8.7*  --  8.7*  MG  --   --  1.8  --   --  2.0  PHOS  --   --   --   --   --  2.4*   GFR: Estimated Creatinine Clearance: 99.4 mL/min (by C-G formula based on SCr of 0.7 mg/dL). Liver Function Tests: Recent Labs  Lab 11/09/23 0929 11/10/23 0532 11/12/23 0412  AST 25 19 21   ALT 24 19 19   ALKPHOS 63 52 55  BILITOT 1.1 0.9 1.6*  PROT 6.7 6.0* 6.0*  ALBUMIN 3.2* 2.9* 2.8*   CBG: No results for input(s): GLUCAP in the last 168 hours.  Recent Results (from the past 240 hours)  Blood culture (routine x 2)     Status: None (Preliminary result)   Collection Time: 11/09/23  9:29 AM   Specimen: BLOOD  Result Value Ref Range Status   Specimen Description BLOOD BLOOD RIGHT ARM  Final   Special Requests   Final    BOTTLES DRAWN AEROBIC AND ANAEROBIC Blood Culture results may not be optimal due to an inadequate volume of blood received in culture bottles   Culture   Final    NO GROWTH 3 DAYS Performed at [facility], [address], [place], [place] [address]    Report Status PENDING  Incomplete  Blood culture (routine x 2)     Status: None (Preliminary result)   Collection Time: 11/09/23  9:33 AM   Specimen: BLOOD  Result Value Ref Range Status   Specimen Description BLOOD BLOOD LEFT ARM  Final   Special Requests   Final    BOTTLES DRAWN AEROBIC AND ANAEROBIC Blood Culture results may not be optimal due to an inadequate volume of blood received in culture bottles   Culture   Final    NO GROWTH 3 DAYS Performed at [facility], [address] [place], [place] [address]     Report Status PENDING  Incomplete  Resp panel by RT-PCR (RSV, Flu A&B, Covid) Anterior Nasal Swab     Status: None   Collection Time: 11/09/23 11:04 AM   Specimen: Anterior Nasal Swab  Result Value  Ref Range Status   SARS Coronavirus 2 by RT PCR NEGATIVE NEGATIVE Final    Comment: (NOTE) SARS-CoV-2 target nucleic acids are NOT DETECTED.  The SARS-CoV-2 RNA is generally detectable in upper respiratory specimens during the acute phase of infection. The lowest concentration of SARS-CoV-2 viral copies this assay can detect is 138 copies/mL. A negative result does not preclude SARS-Cov-2 infection and should not be used as the sole basis for treatment or other patient management decisions. A negative result may occur with  improper specimen collection/handling, submission of specimen other than nasopharyngeal swab, presence of viral mutation(s) within the areas targeted by this assay, and inadequate number of viral copies(<138 copies/mL). A negative result must be combined with clinical observations, patient history, and epidemiological information. The expected result is Negative.  Fact Sheet for Patients:  BloggerCourse.com  Fact Sheet for Healthcare Providers:  SeriousBroker.it  This test is no t yet approved or cleared by the [place]  FDA and  has been authorized for detection and/or diagnosis of SARS-CoV-2 by FDA under an Emergency Use Authorization (EUA). This EUA will remain  in effect (meaning this test can be used) for the duration of the COVID-19 declaration under Section 564(b)(1) of the Act, 21 U.S.C.section 360bbb-3(b)(1), unless the authorization is terminated  or revoked sooner.       Influenza A by PCR NEGATIVE NEGATIVE Final   Influenza B by PCR NEGATIVE NEGATIVE Final    Comment: (NOTE) The Xpert Xpress SARS-CoV-2/FLU/RSV plus assay is intended as an aid in the diagnosis of influenza from Nasopharyngeal  swab specimens and should not be used as a sole basis for treatment. Nasal washings and aspirates are unacceptable for Xpert Xpress SARS-CoV-2/FLU/RSV testing.  Fact Sheet for Patients: BloggerCourse.com  Fact Sheet for Healthcare Providers: SeriousBroker.it  This test is not yet approved or cleared by the [place]  FDA and has been authorized for detection and/or diagnosis of SARS-CoV-2 by FDA under an Emergency Use Authorization (EUA). This EUA will remain in effect (meaning this test can be used) for the duration of the COVID-19 declaration under Section 564(b)(1) of the Act, 21 U.S.C. section 360bbb-3(b)(1), unless the authorization is terminated or revoked.     Resp Syncytial Virus by PCR NEGATIVE NEGATIVE Final    Comment: (NOTE) Fact Sheet for Patients: BloggerCourse.com  Fact Sheet for Healthcare Providers: SeriousBroker.it  This test is not yet approved or cleared by the [place]  FDA and has been authorized for detection and/or diagnosis of SARS-CoV-2 by FDA under an Emergency Use Authorization (EUA). This EUA will remain in effect (meaning this test can be used) for the duration of the COVID-19 declaration under Section 564(b)(1) of the Act, 21 U.S.C. section 360bbb-3(b)(1), unless the authorization is terminated or revoked.  Performed at [facility], [address], [place], [place] [address]      Radiology Studies: No results found.  Scheduled Meds:  doxycycline  100 mg Oral Q12H   enoxaparin  (LOVENOX ) injection  40 mg Subcutaneous Q24H   feeding supplement  237 mL Oral BID BM   guaiFENesin  600 mg Oral BID   ipratropium-albuterol  3 mL Nebulization QID   predniSONE  40 mg Oral Q breakfast   Continuous Infusions:   LOS: 3 days  MDM: Patient is high risk for one or more organ failure.  They necessitate ongoing hospitalization for  continued IV therapies and subsequent lab monitoring. Total time spent interpreting labs and vitals, reviewing the medical record, coordinating care amongst consultants and care  team members, directly assessing and discussing care with the patient and/or family: 55 min George Haggart, DO [facility]  To contact the attending physician between 7A-7P please use Epic Chat. To contact the covering physician during after hours 7P-7A, please review Amion.  11/12/2023, 2:37 PM   *This document has been created with the assistance of dictation software. Please excuse typographical errors. *

## 2023-11-12 NOTE — Progress Notes (Signed)
 Mobility Specialist - Progress Note     11/12/23 1550  Mobility  Activity Ambulated independently  Level of Assistance Modified independent, requires aide device or extra time  Assistive Device None  Distance Ambulated (ft) 160 ft  Range of Motion/Exercises Active  Activity Response Tolerated well  Mobility Referral Yes  Mobility visit 1 Mobility  Mobility Specialist Start Time (ACUTE ONLY) 1507  Mobility Specialist Stop Time (ACUTE ONLY) 1522  Mobility Specialist Time Calculation (min) (ACUTE ONLY) 15 min   Nurse requested Mobility Specialist to perform oxygen saturation test with pt which includes removing pt from oxygen both at rest and while ambulating.  Below are the results from that testing.     Patient Saturations on Room Air at Rest = spO2 90%  Patient Saturations on Room Air while Ambulating = sp02 86% .  Rested and performed pursed lip breathing for 1 minute with sp02 at 83%.  Patient remained asymptomatic and returned to room on RA, Pt immediately put back on O2 2L. Pt pushed to 3L for several minutes to recover back to 90%.   At end of testing pt left in room on 2  Liters of oxygen.  Reported results to nurse.    Pt resting EOB upon entry. Pt STS and ambulates to hallway around NS for 1 lap with no AD. Patient takes x2 standing rest breaks during ambulation and remained asymptomatic throughout only become SOB once back in room. Pt left in bed with needs in reach and bed alarm activated.   Zachary Joseph Mobility Specialist 11/12/23, 3:55 PM   Zachary Joseph Mobility Specialist 11/12/23, 3:54 PM

## 2023-11-12 NOTE — Plan of Care (Signed)
  Problem: Activity: Goal: Risk for activity intolerance will decrease Outcome: Progressing   Problem: Coping: Goal: Level of anxiety will decrease Outcome: Progressing   Problem: Elimination: Goal: Will not experience complications related to bowel motility Outcome: Progressing   Problem: Pain Managment: Goal: General experience of comfort will improve and/or be controlled Outcome: Progressing   Problem: Safety: Goal: Ability to remain free from injury will improve Outcome: Progressing   Problem: Education: Goal: Knowledge of disease or condition will improve Outcome: Progressing

## 2023-11-12 NOTE — Progress Notes (Signed)
SATURATION QUALIFICATIONS: (This note is used to comply with regulatory documentation for home oxygen)  Patient Saturations on Room Air at Rest = 88%  Patient Saturations on Room Air while Ambulating = 84%  Patient Saturations on 2 Liters of oxygen while Ambulating =94%

## 2023-11-12 NOTE — Progress Notes (Signed)
 11/12/2023 1:51 PM -----------------------------------------------------------[facility]--------------------------------------------------- D(Data) A(Action) R(response)     Data: Discharge Readiness Assessment EDD currently listed for tomorrow 11/13/2023    Action: Chart reviewed    Response: No immediate Barriers to discharge identified at this time. ICM closely following for discharge needs.     Gilberto Streck, RN [employer] [facility]

## 2023-11-13 ENCOUNTER — Other Ambulatory Visit (HOSPITAL_COMMUNITY): Payer: Self-pay

## 2023-11-13 ENCOUNTER — Other Ambulatory Visit: Payer: Self-pay

## 2023-11-13 LAB — CBC WITH DIFFERENTIAL/PLATELET
Abs Immature Granulocytes: 0.04 K/uL (ref 0.00–0.07)
Basophils Absolute: 0 K/uL (ref 0.0–0.1)
Basophils Relative: 0 %
Eosinophils Absolute: 0 K/uL (ref 0.0–0.5)
Eosinophils Relative: 0 %
HCT: 53.8 % — ABNORMAL HIGH (ref 39.0–52.0)
Hemoglobin: 17.2 g/dL — ABNORMAL HIGH (ref 13.0–17.0)
Immature Granulocytes: 0 %
Lymphocytes Relative: 22 %
Lymphs Abs: 2.8 K/uL (ref 0.7–4.0)
MCH: 30.1 pg (ref 26.0–34.0)
MCHC: 32 g/dL (ref 30.0–36.0)
MCV: 94.2 fL (ref 80.0–100.0)
Monocytes Absolute: 1.3 K/uL — ABNORMAL HIGH (ref 0.1–1.0)
Monocytes Relative: 10 %
Neutro Abs: 8.7 K/uL — ABNORMAL HIGH (ref 1.7–7.7)
Neutrophils Relative %: 68 %
Platelets: 242 K/uL (ref 150–400)
RBC: 5.71 MIL/uL (ref 4.22–5.81)
RDW: 12 % (ref 11.5–15.5)
WBC: 12.9 K/uL — ABNORMAL HIGH (ref 4.0–10.5)
nRBC: 0 % (ref 0.0–0.2)

## 2023-11-13 LAB — COMPREHENSIVE METABOLIC PANEL WITH GFR
ALT: 20 U/L (ref 0–44)
AST: 19 U/L (ref 15–41)
Albumin: 2.8 g/dL — ABNORMAL LOW (ref 3.5–5.0)
Alkaline Phosphatase: 55 U/L (ref 38–126)
Anion gap: 9 (ref 5–15)
BUN: 18 mg/dL (ref 6–20)
CO2: 35 mmol/L — ABNORMAL HIGH (ref 22–32)
Calcium: 8.5 mg/dL — ABNORMAL LOW (ref 8.9–10.3)
Chloride: 96 mmol/L — ABNORMAL LOW (ref 98–111)
Creatinine, Ser: 0.69 mg/dL (ref 0.61–1.24)
GFR, Estimated: >60 mL/min (ref >60)
Glucose, Bld: 92 mg/dL (ref 70–99)
Potassium: 3.5 mmol/L (ref 3.5–5.1)
Sodium: 140 mmol/L (ref 135–145)
Total Bilirubin: 1.6 mg/dL — ABNORMAL HIGH (ref 0.0–1.2)
Total Protein: 6.2 g/dL — ABNORMAL LOW (ref 6.5–8.1)

## 2023-11-13 LAB — PHOSPHORUS: Phosphorus: 4.1 mg/dL (ref 2.5–4.6)

## 2023-11-13 LAB — MAGNESIUM: Magnesium: 2 mg/dL (ref 1.7–2.4)

## 2023-11-13 MED ORDER — FLUTICASONE FUROATE-VILANTEROL 200-25 MCG/ACT IN AEPB
1.0000 | INHALATION_SPRAY | Freq: Every day | RESPIRATORY_TRACT | 11 refills | Status: AC
  Filled 2023-11-13: qty 60, 60d supply, fill #0

## 2023-11-13 MED ORDER — LOSARTAN POTASSIUM 25 MG PO TABS
25.0000 mg | ORAL_TABLET | Freq: Every day | ORAL | 0 refills | Status: AC
  Filled 2023-11-13: qty 30, 30d supply, fill #0

## 2023-11-13 MED ORDER — ALBUTEROL SULFATE HFA 108 (90 BASE) MCG/ACT IN AERS
2.0000 | INHALATION_SPRAY | Freq: Four times a day (QID) | RESPIRATORY_TRACT | 2 refills | Status: AC | PRN
  Filled 2023-11-13: qty 6.7, 25d supply, fill #0

## 2023-11-13 NOTE — Discharge Summary (Signed)
 DISCHARGE SUMMARY    Zachary Joseph FMW:[id number] DOB: December 11, 1965 DOA: 11/09/2023  PCP: Patient, No Pcp Per  Admit date: 11/09/2023 Discharge date: 11/13/2023   Recommendations for Outpatient Follow-up:  1.  Referral to pulmonology for COPD and chronic oxygen management 2. Referral to hematology for Erythrocytosis   Hospital Course: Zachary Joseph is a 58 year old male with tobacco abuse, COPD, who presents to the ED with acute shortness of breath.  He was admitted for COPD exacerbation and acute hypoxic respiratory failure.  He was started on steroids, doxycycline, and supplemental O2.  He was also found to have an AKI which solved with IV fluids. Despite 5 days of steroid and antibiotic therapy patient was unable to wean entirely to room air.  On walk trial he continued to desaturate.  Given his active and prolonged tobacco abuse I do suspect he now has chronic respiratory failure.  He was arranged for home oxygen which was delivered to bedside prior to discharge.  Additionally, he does not currently see a pulmonologist and is not on any maintenance inhalers.  He was provided a free sample of Breo and pulmonology referral was placed. His care is otherwise stated below.   COPD exacerbation Acute hypoxic respiratory failure Acute bronchitis - Initially 85% on room air, currently on 2 to 3 L [place].  Still dyspneic on exertion - Desaturates with ambulation - Status post 5-day steroid and doxycycline. - Rx sent for Anoro, this is cost prohibitive as the patient is currently uninsured.  Pharmacy was able to provide free sample of Breo - At this time I am concerned that he has developed chronic hypoxic respiratory failure and may not be able to wean off of oxygen in the future.  2 L O2 arranged and delivered prior to discharge.  Had extensive conversation with the patient on the dangers of continuing to smoke while on oxygen therapy, he endorses understanding and reports he will no longer be  smoking.  Patient also appears resistant to oxygen therapy and reports that he has been hypoxic for a long time without issue.  We extensively discussed clubbing of his nails and the erythrocytosis as evidence that his body requires a higher level of oxygen. - Instructed on incentive spirometry and flutter valve use - RVP negative  AKI - Creatinine 1.27 on arrival - Creatinine has now improved down to 0.7. - Discontinue further IV fluids.  Encourage p.o. intake.   Hyperkalemia - Resolved   Hypophosphatemia - Replaced   Chronic tobacco abuse - Smoking 1.5 packs/day.  He denies need for nicotine patch at this time.   - Has been counseled on smoking cessation   Erythrocytosis - Baseline hemoglobin appears to be WNL 48yrs ago but on this admission has been persistently above 18 - This may be secondary to chronic hypoxia given COPD and daily tobacco abuse - Peripheral smear reveals unremarkable RBC morphology. - PTT mildly elevated, INR 1.2 - Patient denies history of testosterone use - Hematology referral at DC   Elevated blood pressure without history of hypertension - Losartan low-dose at discharge - Follow-up with PCP for continued prescribing and titration  Discharge Instructions  Discharge Instructions     Ambulatory referral to Hematology / Oncology   Complete by: As directed    Call MD for:  difficulty breathing, headache or visual disturbances   Complete by: As directed    Call MD for:  persistant dizziness or light-headedness   Complete by: As directed    Call MD for:  persistant nausea and vomiting   Complete by: As directed    Call MD for:  severe uncontrolled pain   Complete by: As directed    Call MD for:  temperature >100.4   Complete by: As directed    Diet general   Complete by: As directed    Discharge instructions   Complete by: As directed    You have been referred to pulmonology.  Please follow-up with them closely to ensure your COPD is being managed  and your oxygen needs are followed.  You have also been referred to hematology to follow-up on your Hgb level.  Please keep this appointment for further workup   Increase activity slowly   Complete by: As directed    Pulmonary Visit   Complete by: As directed    Severe COPD, chronic resp failure   Reason for referral: Other Pulmonary      Allergies as of 11/13/2023   No Known Allergies      Medication List     TAKE these medications    albuterol 108 (90 Base) MCG/ACT inhaler Commonly known as: VENTOLIN HFA Inhale 2 puffs into the lungs every 6 (six) hours as needed for wheezing or shortness of breath.   fluticasone furoate-vilanterol 100-25 MCG/ACT Aepb Commonly known as: BREO ELLIPTA Inhale 1 puff into the lungs daily.   losartan 25 MG tablet Commonly known as: Cozaar Take 1 tablet (25 mg total) by mouth daily.               Durable Medical Equipment  (From admission, onward)           Start     Ordered   11/12/23 1534  For home use only DME oxygen  Once       Question Answer Comment  Length of Need Lifetime   Mode or (Route) Nasal cannula   Liters per Minute 2   Frequency Continuous (stationary and portable oxygen unit needed)   Oxygen conserving device Yes   Oxygen delivery system Gas      11/12/23 1533            No Known Allergies  Consultations:    Procedures/Studies: DG Chest 2 View Result Date: 11/09/2023 CLINICAL DATA:  Shortness of breath. EXAM: CHEST - 2 VIEW COMPARISON:  None Available. FINDINGS: The heart size and mediastinal contours are within normal limits. Mild atelectatic changes are suspected within the bilateral lung bases. No acute infiltrate, pleural effusion or pneumothorax is identified. The visualized skeletal structures are unremarkable. IMPRESSION: No active cardiopulmonary disease. Electronically Signed   By: Suzen Dials M.D.   On: 11/09/2023 10:32      Discharge Exam: Vitals:   11/13/23 0337 11/13/23  0743  BP: (!) 159/91 (!) 157/94  Pulse: 66 66  Resp: 16 16  Temp: 98.4 F (36.9 C) 97.6 F (36.4 C)  SpO2: 97% 95%   Vitals:   11/12/23 1921 11/12/23 2014 11/13/23 0337 11/13/23 0743  BP: (!) 143/97  (!) 159/91 (!) 157/94  Pulse: 96  66 66  Resp: 18  16 16   Temp: 98.4 F (36.9 C)  98.4 F (36.9 C) 97.6 F (36.4 C)  TempSrc:    Oral  SpO2: 91% 91% 97% 95%  Weight:      Height:        Constitutional:  Normal appearance HENT: Head Normocephalic and atraumatic.  Mucous membranes are moist.  Poor dentition Cardiovascular: Rate and Rhythm: Normal rate and regular rhythm.  Pulmonary  Poor aeration though improved from prior exams, no wheezing.  Dyspneic when speaking.  On 2 L. Extremities: Clubbing of fingernails Neurological: No focal deficit present. alert. Oriented. Psychiatric: Mood and Affect congruent.    The results of significant diagnostics from this hospitalization (including imaging, microbiology, ancillary and laboratory) are listed below for reference.     Microbiology: Recent Results (from the past 240 hours)  Blood culture (routine x 2)     Status: None (Preliminary result)   Collection Time: 11/09/23  9:29 AM   Specimen: BLOOD  Result Value Ref Range Status   Specimen Description BLOOD BLOOD RIGHT ARM  Final   Special Requests   Final    BOTTLES DRAWN AEROBIC AND ANAEROBIC Blood Culture results may not be optimal due to an inadequate volume of blood received in culture bottles   Culture   Final    NO GROWTH 4 DAYS Performed at [facility], [address] [place], [place] [address]    Report Status PENDING  Incomplete  Blood culture (routine x 2)     Status: None (Preliminary result)   Collection Time: 11/09/23  9:33 AM   Specimen: BLOOD  Result Value Ref Range Status   Specimen Description BLOOD BLOOD LEFT ARM  Final   Special Requests   Final    BOTTLES DRAWN AEROBIC AND ANAEROBIC Blood Culture results may not be optimal due to an  inadequate volume of blood received in culture bottles   Culture   Final    NO GROWTH 4 DAYS Performed at [facility], [address] [place], [place] [address]    Report Status PENDING  Incomplete  Resp panel by RT-PCR (RSV, Flu A&B, Covid) Anterior Nasal Swab     Status: None   Collection Time: 11/09/23 11:04 AM   Specimen: Anterior Nasal Swab  Result Value Ref Range Status   SARS Coronavirus 2 by RT PCR NEGATIVE NEGATIVE Final    Comment: (NOTE) SARS-CoV-2 target nucleic acids are NOT DETECTED.  The SARS-CoV-2 RNA is generally detectable in upper respiratory specimens during the acute phase of infection. The lowest concentration of SARS-CoV-2 viral copies this assay can detect is 138 copies/mL. A negative result does not preclude SARS-Cov-2 infection and should not be used as the sole basis for treatment or other patient management decisions. A negative result may occur with  improper specimen collection/handling, submission of specimen other than nasopharyngeal swab, presence of viral mutation(s) within the areas targeted by this assay, and inadequate number of viral copies(<138 copies/mL). A negative result must be combined with clinical observations, patient history, and epidemiological information. The expected result is Negative.  Fact Sheet for Patients:  BloggerCourse.com  Fact Sheet for Healthcare Providers:  SeriousBroker.it  This test is no t yet approved or cleared by the [place]  FDA and  has been authorized for detection and/or diagnosis of SARS-CoV-2 by FDA under an Emergency Use Authorization (EUA). This EUA will remain  in effect (meaning this test can be used) for the duration of the COVID-19 declaration under Section 564(b)(1) of the Act, 21 U.S.C.section 360bbb-3(b)(1), unless the authorization is terminated  or revoked sooner.       Influenza A by PCR NEGATIVE NEGATIVE Final    Influenza B by PCR NEGATIVE NEGATIVE Final    Comment: (NOTE) The Xpert Xpress SARS-CoV-2/FLU/RSV plus assay is intended as an aid in the diagnosis of influenza from Nasopharyngeal swab specimens and should not be used as a sole basis for treatment. Nasal  washings and aspirates are unacceptable for Xpert Xpress SARS-CoV-2/FLU/RSV testing.  Fact Sheet for Patients: BloggerCourse.com  Fact Sheet for Healthcare Providers: SeriousBroker.it  This test is not yet approved or cleared by the [place]  FDA and has been authorized for detection and/or diagnosis of SARS-CoV-2 by FDA under an Emergency Use Authorization (EUA). This EUA will remain in effect (meaning this test can be used) for the duration of the COVID-19 declaration under Section 564(b)(1) of the Act, 21 U.S.C. section 360bbb-3(b)(1), unless the authorization is terminated or revoked.     Resp Syncytial Virus by PCR NEGATIVE NEGATIVE Final    Comment: (NOTE) Fact Sheet for Patients: BloggerCourse.com  Fact Sheet for Healthcare Providers: SeriousBroker.it  This test is not yet approved or cleared by the [place]  FDA and has been authorized for detection and/or diagnosis of SARS-CoV-2 by FDA under an Emergency Use Authorization (EUA). This EUA will remain in effect (meaning this test can be used) for the duration of the COVID-19 declaration under Section 564(b)(1) of the Act, 21 U.S.C. section 360bbb-3(b)(1), unless the authorization is terminated or revoked.  Performed at [facility], [address] [place], [place] [address]      Labs: BNP (last 3 results) Recent Labs    11/09/23 0929  BNP 151.8*   Basic Metabolic Panel: Recent Labs  Lab 11/09/23 0929 11/09/23 1320 11/10/23 0528 11/10/23 0532 11/11/23 0529 11/12/23 0412 11/13/23 0512  NA 141  --   --  141  --  142 140  K 4.0  --   --   5.3* 3.9 4.0 3.5  CL 95*  --   --  100  --  97* 96*  CO2 29  --   --  26  --  33* 35*  GLUCOSE 149*  --   --  130*  --  92 92  BUN 11  --   --  15  --  16 18  CREATININE 1.27* 0.96  --  0.98  --  0.70 0.69  CALCIUM 8.7*  --   --  8.7*  --  8.7* 8.5*  MG  --   --  1.8  --   --  2.0 2.0  PHOS  --   --   --   --   --  2.4* 4.1   Liver Function Tests: Recent Labs  Lab 11/09/23 0929 11/10/23 0532 11/12/23 0412 11/13/23 0512  AST 25 19 21 19   ALT 24 19 19 20   ALKPHOS 63 52 55 55  BILITOT 1.1 0.9 1.6* 1.6*  PROT 6.7 6.0* 6.0* 6.2*  ALBUMIN 3.2* 2.9* 2.8* 2.8*   No results for input(s): LIPASE, AMYLASE in the last 168 hours. No results for input(s): AMMONIA in the last 168 hours. CBC: Recent Labs  Lab 11/09/23 0929 11/09/23 1320 11/10/23 0532 11/12/23 0412 11/13/23 0512  WBC 15.2* 11.4* 11.3* 12.9* 12.9*  NEUTROABS  --   --   --  8.9* 8.7*  HGB 18.3* 18.8* 18.0* 17.1* 17.2*  HCT 57.0* 57.7* 55.4* 54.6* 53.8*  MCV 94.1 94.0 93.4 95.6 94.2  PLT 249 223 230 248 242   Cardiac Enzymes: No results for input(s): CKTOTAL, CKMB, CKMBINDEX, TROPONINI in the last 168 hours. BNP: Invalid input(s): POCBNP CBG: No results for input(s): GLUCAP in the last 168 hours. D-Dimer No results for input(s): DDIMER in the last 72 hours. Hgb A1c No results for input(s): HGBA1C in the last 72 hours. Lipid Profile No results for input(s): CHOL, HDL, LDLCALC,  TRIG, CHOLHDL, LDLDIRECT in the last 72 hours. Thyroid function studies No results for input(s): TSH, T4TOTAL, T3FREE, THYROIDAB in the last 72 hours.  Invalid input(s): FREET3 Anemia work up No results for input(s): VITAMINB12, FOLATE, FERRITIN, TIBC, IRON, RETICCTPCT in the last 72 hours. Urinalysis    Component Value Date/Time   COLORURINE AMBER (A) 08/24/2019 1242   APPEARANCEUR Cloudy (A) 10/04/2019 1452   LABSPEC 1.019 08/24/2019 1242   PHURINE 5.0 08/24/2019 1242    GLUCOSEU Negative 10/04/2019 1452   HGBUR LARGE (A) 08/24/2019 1242   BILIRUBINUR Negative 10/04/2019 1452   KETONESUR NEGATIVE 08/24/2019 1242   PROTEINUR Trace (A) 10/04/2019 1452   PROTEINUR 100 (A) 08/24/2019 1242   NITRITE Negative 10/04/2019 1452   NITRITE NEGATIVE 08/24/2019 1242   LEUKOCYTESUR Negative 10/04/2019 1452   LEUKOCYTESUR LARGE (A) 08/24/2019 1242   Sepsis Labs Recent Labs  Lab 11/09/23 1320 11/10/23 0532 11/12/23 0412 11/13/23 0512  WBC 11.4* 11.3* 12.9* 12.9*   Microbiology Recent Results (from the past 240 hours)  Blood culture (routine x 2)     Status: None (Preliminary result)   Collection Time: 11/09/23  9:29 AM   Specimen: BLOOD  Result Value Ref Range Status   Specimen Description BLOOD BLOOD RIGHT ARM  Final   Special Requests   Final    BOTTLES DRAWN AEROBIC AND ANAEROBIC Blood Culture results may not be optimal due to an inadequate volume of blood received in culture bottles   Culture   Final    NO GROWTH 4 DAYS Performed at [facility], [address], [place], [place] [address]    Report Status PENDING  Incomplete  Blood culture (routine x 2)     Status: None (Preliminary result)   Collection Time: 11/09/23  9:33 AM   Specimen: BLOOD  Result Value Ref Range Status   Specimen Description BLOOD BLOOD LEFT ARM  Final   Special Requests   Final    BOTTLES DRAWN AEROBIC AND ANAEROBIC Blood Culture results may not be optimal due to an inadequate volume of blood received in culture bottles   Culture   Final    NO GROWTH 4 DAYS Performed at [facility], [address], [place], [place] [address]    Report Status PENDING  Incomplete  Resp panel by RT-PCR (RSV, Flu A&B, Covid) Anterior Nasal Swab     Status: None   Collection Time: 11/09/23 11:04 AM   Specimen: Anterior Nasal Swab  Result Value Ref Range Status   SARS Coronavirus 2 by RT PCR NEGATIVE NEGATIVE Final    Comment: (NOTE) SARS-CoV-2 target nucleic acids are  NOT DETECTED.  The SARS-CoV-2 RNA is generally detectable in upper respiratory specimens during the acute phase of infection. The lowest concentration of SARS-CoV-2 viral copies this assay can detect is 138 copies/mL. A negative result does not preclude SARS-Cov-2 infection and should not be used as the sole basis for treatment or other patient management decisions. A negative result may occur with  improper specimen collection/handling, submission of specimen other than nasopharyngeal swab, presence of viral mutation(s) within the areas targeted by this assay, and inadequate number of viral copies(<138 copies/mL). A negative result must be combined with clinical observations, patient history, and epidemiological information. The expected result is Negative.  Fact Sheet for Patients:  BloggerCourse.com  Fact Sheet for Healthcare Providers:  SeriousBroker.it  This test is no t yet approved or cleared by the [place]  FDA and  has been authorized for detection and/or  diagnosis of SARS-CoV-2 by FDA under an Emergency Use Authorization (EUA). This EUA will remain  in effect (meaning this test can be used) for the duration of the COVID-19 declaration under Section 564(b)(1) of the Act, 21 U.S.C.section 360bbb-3(b)(1), unless the authorization is terminated  or revoked sooner.       Influenza A by PCR NEGATIVE NEGATIVE Final   Influenza B by PCR NEGATIVE NEGATIVE Final    Comment: (NOTE) The Xpert Xpress SARS-CoV-2/FLU/RSV plus assay is intended as an aid in the diagnosis of influenza from Nasopharyngeal swab specimens and should not be used as a sole basis for treatment. Nasal washings and aspirates are unacceptable for Xpert Xpress SARS-CoV-2/FLU/RSV testing.  Fact Sheet for Patients: BloggerCourse.com  Fact Sheet for Healthcare Providers: SeriousBroker.it  This test is not  yet approved or cleared by the [place]  FDA and has been authorized for detection and/or diagnosis of SARS-CoV-2 by FDA under an Emergency Use Authorization (EUA). This EUA will remain in effect (meaning this test can be used) for the duration of the COVID-19 declaration under Section 564(b)(1) of the Act, 21 U.S.C. section 360bbb-3(b)(1), unless the authorization is terminated or revoked.     Resp Syncytial Virus by PCR NEGATIVE NEGATIVE Final    Comment: (NOTE) Fact Sheet for Patients: BloggerCourse.com  Fact Sheet for Healthcare Providers: SeriousBroker.it  This test is not yet approved or cleared by the [place]  FDA and has been authorized for detection and/or diagnosis of SARS-CoV-2 by FDA under an Emergency Use Authorization (EUA). This EUA will remain in effect (meaning this test can be used) for the duration of the COVID-19 declaration under Section 564(b)(1) of the Act, 21 U.S.C. section 360bbb-3(b)(1), unless the authorization is terminated or revoked.  Performed at [facility], [address] [place], [place] [address]      Time coordinating discharge: 32 min   SIGNED: Ezariah Nace, DO [facility] 11/13/2023, 3:45 PM Pager   If 7PM-7AM, please contact night-coverage

## 2023-11-13 NOTE — TOC Progression Note (Addendum)
 Transition of Care [facility]) - Progression Note    Patient Details  Name: Zachary Joseph MRN: [id number] Date of Birth: 04-09-1965  [facility]) CM/SW Contact  Dalia GORMAN Fuse, RN Phone Number: 11/13/2023, 8:38 AM  Clinical Narrative:     Orders are in for home 02. Referral for home O2 sent to [employer]. [facility] has not responded. TOC sent referral in hub.  10:52: Mitch accepted referral  11:34 TOC received a message advising O2 has been delivered to the patient's room.  TOC will continue to follow.                    Expected Discharge Plan and Services                                               Social Drivers of Health (SDOH) Interventions SDOH Screenings   Food Insecurity: No Food Insecurity (11/10/2023)  Housing: Low Risk  (11/10/2023)  Transportation Needs: No Transportation Needs (11/10/2023)  Utilities: Not At Risk (11/10/2023)  Social Connections: Socially Isolated (11/10/2023)  Tobacco Use: High Risk (11/09/2023)    Readmission Risk Interventions     No data to display

## 2023-11-13 NOTE — Plan of Care (Signed)
  Problem: Education: Goal: Knowledge of General Education information will improve Description: Including pain rating scale, medication(s)/side effects and non-pharmacologic comfort measures Outcome: Progressing   Problem: Health Behavior/Discharge Planning: Goal: Ability to manage health-related needs will improve Outcome: Progressing   Problem: Clinical Measurements: Goal: Ability to maintain clinical measurements within normal limits will improve Outcome: Progressing Goal: Will remain free from infection Outcome: Progressing Goal: Diagnostic test results will improve Outcome: Progressing Goal: Respiratory complications will improve Outcome: Progressing Goal: Cardiovascular complication will be avoided Outcome: Progressing   Problem: Activity: Goal: Risk for activity intolerance will decrease Outcome: Progressing   Problem: Nutrition: Goal: Adequate nutrition will be maintained Outcome: Progressing   Problem: Coping: Goal: Level of anxiety will decrease Outcome: Progressing   Problem: Elimination: Goal: Will not experience complications related to bowel motility Outcome: Progressing Goal: Will not experience complications related to urinary retention Outcome: Progressing   Problem: Pain Managment: Goal: General experience of comfort will improve and/or be controlled Outcome: Progressing   Problem: Safety: Goal: Ability to remain free from injury will improve Outcome: Progressing   Problem: Skin Integrity: Goal: Risk for impaired skin integrity will decrease Outcome: Progressing   Problem: Education: Goal: Knowledge of disease or condition will improve Outcome: Progressing Goal: Knowledge of the prescribed therapeutic regimen will improve Outcome: Progressing Goal: Individualized Educational Video(s) Outcome: Progressing

## 2023-11-13 NOTE — TOC Transition Note (Signed)
 Transition of Care [facility]) - Discharge Note   Patient Details  Name: Zachary Joseph MRN: [id number] Date of Birth: 05-Aug-1965  [facility] CM/SW Contact:  Dalia GORMAN Fuse, RN Phone Number: 11/13/2023, 3:20 PM   Clinical Narrative:    Patient is medically clear to discharge to home with Home O2. Patient currently doesn't have a payor. Mitch with [employer] was able to accept referral under [facility] care.  No other TOC needs identified.   Final next level of care: Home/Self Care Barriers to Discharge: Continued Medical Work up, Barriers Resolved   Patient Goals and CMS Choice            Discharge Placement                       Discharge Plan and Services Additional resources added to the After Visit Summary for                  DME Arranged: Oxygen DME Agency: [employer] Date DME Agency Contacted: 11/13/23 Time DME Agency Contacted: 0830 Representative spoke with at DME Agency: [facility]            Social Drivers of Health (SDOH) Interventions SDOH Screenings   Food Insecurity: No Food Insecurity (11/10/2023)  Housing: Low Risk  (11/10/2023)  Transportation Needs: No Transportation Needs (11/10/2023)  Utilities: Not At Risk (11/10/2023)  Social Connections: Socially Isolated (11/10/2023)  Tobacco Use: High Risk (11/09/2023)     Readmission Risk Interventions     No data to display

## 2023-11-14 LAB — CULTURE, BLOOD (ROUTINE X 2)
Culture: NO GROWTH
Culture: NO GROWTH

## 2023-12-09 ENCOUNTER — Encounter: Payer: Self-pay | Admitting: Oncology

## 2023-12-09 ENCOUNTER — Inpatient Hospital Stay

## 2023-12-09 ENCOUNTER — Inpatient Hospital Stay: Attending: Oncology | Admitting: Oncology

## 2023-12-09 VITALS — BP 157/90 | HR 79 | Temp 97.2°F | Resp 18 | Wt 152.3 lb

## 2023-12-09 DIAGNOSIS — R634 Abnormal weight loss: Secondary | ICD-10-CM | POA: Diagnosis not present

## 2023-12-09 DIAGNOSIS — D751 Secondary polycythemia: Secondary | ICD-10-CM | POA: Diagnosis not present

## 2023-12-09 DIAGNOSIS — F17210 Nicotine dependence, cigarettes, uncomplicated: Secondary | ICD-10-CM | POA: Diagnosis not present

## 2023-12-09 DIAGNOSIS — Z803 Family history of malignant neoplasm of breast: Secondary | ICD-10-CM

## 2023-12-09 DIAGNOSIS — Z720 Tobacco use: Secondary | ICD-10-CM

## 2023-12-09 DIAGNOSIS — J9610 Chronic respiratory failure, unspecified whether with hypoxia or hypercapnia: Secondary | ICD-10-CM

## 2023-12-09 DIAGNOSIS — Z9981 Dependence on supplemental oxygen: Secondary | ICD-10-CM

## 2023-12-09 LAB — CBC WITH DIFFERENTIAL/PLATELET
Abs Immature Granulocytes: 0.03 K/uL (ref 0.00–0.07)
Basophils Absolute: 0 K/uL (ref 0.0–0.1)
Basophils Relative: 0 %
Eosinophils Absolute: 0.2 K/uL (ref 0.0–0.5)
Eosinophils Relative: 2 %
HCT: 53.4 % — ABNORMAL HIGH (ref 39.0–52.0)
Hemoglobin: 17.2 g/dL — ABNORMAL HIGH (ref 13.0–17.0)
Immature Granulocytes: 0 %
Lymphocytes Relative: 30 %
Lymphs Abs: 2.7 K/uL (ref 0.7–4.0)
MCH: 30.3 pg (ref 26.0–34.0)
MCHC: 32.2 g/dL (ref 30.0–36.0)
MCV: 94 fL (ref 80.0–100.0)
Monocytes Absolute: 0.7 K/uL (ref 0.1–1.0)
Monocytes Relative: 7 %
Neutro Abs: 5.3 K/uL (ref 1.7–7.7)
Neutrophils Relative %: 61 %
Platelets: 234 K/uL (ref 150–400)
RBC: 5.68 MIL/uL (ref 4.22–5.81)
RDW: 12.1 % (ref 11.5–15.5)
WBC: 8.9 K/uL (ref 4.0–10.5)
nRBC: 0 % (ref 0.0–0.2)

## 2023-12-09 LAB — HEPATIC FUNCTION PANEL
ALT: 12 U/L (ref 0–44)
AST: 15 U/L (ref 15–41)
Albumin: 3.7 g/dL (ref 3.5–5.0)
Alkaline Phosphatase: 62 U/L (ref 38–126)
Bilirubin, Direct: 0.1 mg/dL (ref 0.0–0.2)
Indirect Bilirubin: 0.7 mg/dL (ref 0.3–0.9)
Total Bilirubin: 0.8 mg/dL (ref 0.0–1.2)
Total Protein: 7.2 g/dL (ref 6.5–8.1)

## 2023-12-09 NOTE — Assessment & Plan Note (Signed)
 Recommend imaging work up. Patient declined.

## 2023-12-09 NOTE — Progress Notes (Signed)
 Pt here to establish care.

## 2023-12-09 NOTE — Progress Notes (Signed)
 Hematology/Oncology Consult note [phone] [phone]        REFERRING PROVIDER: Leesa Kast, DO   CHIEF COMPLAINTS/REASON FOR VISIT:  Evaluation of erythrocytosis   ASSESSMENT & PLAN:   Erythrocytosis Labs are reviewed and discussed with patient.  Erythrocytosis is an abnormal elevation of hemoglobin and/or hematocrit in peripheral blood, and this can be caused by primary etiology, ie myeloproliferative disease, or secondary etiology, ie sleep apnea, smoking, testosterone etc  or familiar condition.   Clinically, most likely his erythrocytosis secondary to smoking/hypoxia from underlying chronic pulmonary disorders. I will check erythropoietin, carbo monoxide level, JAK2 with reflex to other mutations, BCR-ABL1 FISH.     Tobacco abuse Recommend smoke cessation.  Recommend lung cancer screening CT. He has upcoming appointment with pulmonology and prefers to defer imaging now.   Unintentional weight loss Recommend imaging work up. Patient declined.    Orders Placed This Encounter  Procedures   CBC with Differential/Platelet    Standing Status:   Future    Number of Occurrences:   1    Expected Date:   12/09/2023    Expiration Date:   03/08/2024   BCR-ABL1 FISH    Standing Status:   Future    Number of Occurrences:   1    Expected Date:   12/09/2023    Expiration Date:   03/08/2024   Carbon monoxide, blood (performed at ref lab)    Standing Status:   Future    Number of Occurrences:   1    Expected Date:   12/09/2023    Expiration Date:   03/08/2024   Erythropoietin    Standing Status:   Future    Number of Occurrences:   1    Expected Date:   12/09/2023    Expiration Date:   03/08/2024   JAK2 [id number] rfx CALR/MPL/E12-15    Standing Status:   Future    Number of Occurrences:   1    Expected Date:   12/09/2023    Expiration Date:   03/08/2024   Hepatic function panel    Standing Status:   Future    Number of Occurrences:   1    Expected  Date:   12/09/2023    Expiration Date:   03/08/2024   Follow up TBD All questions were answered. The patient knows to call the clinic with any problems, questions or concerns.  Zelphia Cap, MD, PhD [facility] Hematology Oncology 12/09/2023   HISTORY OF PRESENTING ILLNESS:   Zachary Joseph is a  58 y.o.  male with PMH listed below was seen in consultation at the request of  Leesa Kast, DO  for evaluation of erythrocytosis  Discussed the use of AI scribe software for clinical note transcription with the patient, who gave verbal consent to proceed.   Patient is a current everyday smoker.  1 pack a day.  He has shortness of breath due to chronic respiratory failure and uses oxygen at night.  He coughs up mucus since.  Denies hemoptysis.  He denies testosterone use Is accompanied by cousin today.  Patient has also noted unintentional weight loss about 40 pounds, over the past year for longer than a year.  He is not sure.   MEDICAL HISTORY:  History reviewed. No pertinent past medical history.  SURGICAL HISTORY: Past Surgical History:  Procedure Laterality Date   HERNIA REPAIR      SOCIAL HISTORY: Social History   Socioeconomic History   Marital status: Married  Spouse name: Not on file   Number of children: Not on file   Years of education: Not on file   Highest education level: Not on file  Occupational History   Not on file  Tobacco Use   Smoking status: Every Day    Current packs/day: 0.50    Types: Cigarettes   Smokeless tobacco: Never  Vaping Use   Vaping status: Never Used  Substance and Sexual Activity   Alcohol use: Yes    Comment: occasional   Drug use: Not Currently   Sexual activity: Not on file  Other Topics Concern   Not on file  Social History Narrative   Not on file   Social Drivers of Health   Financial Resource Strain: Not on file  Food Insecurity: No Food Insecurity (12/09/2023)   Hunger Vital Sign    Worried About Running Out of Food in  the Last Year: Never true    Ran Out of Food in the Last Year: Never true  Transportation Needs: No Transportation Needs (12/09/2023)   PRAPARE - Administrator, Civil Service (Medical): No    Lack of Transportation (Non-Medical): No  Physical Activity: Not on file  Stress: Not on file  Social Connections: Socially Isolated (11/10/2023)   Social Connection and Isolation Panel    Frequency of Communication with Friends and Family: More than three times a week    Frequency of Social Gatherings with Friends and Family: More than three times a week    Attends Religious Services: Never    Database Administrator or Organizations: No    Attends Banker Meetings: Never    Marital Status: Divorced  Catering Manager Violence: Not At Risk (12/09/2023)   Humiliation, Afraid, Rape, and Kick questionnaire    Fear of Current or Ex-Partner: No    Emotionally Abused: No    Physically Abused: No    Sexually Abused: No    FAMILY HISTORY: Family History  Problem Relation Age of Onset   COPD Mother    Breast cancer Maternal Aunt    Cancer Maternal Grandfather     ALLERGIES:  has no known allergies.  MEDICATIONS:  Current Outpatient Medications  Medication Sig Dispense Refill   albuterol (VENTOLIN HFA) 108 (90 Base) MCG/ACT inhaler Inhale 2 puffs into the lungs every 6 (six) hours as needed for wheezing or shortness of breath. 6.7 g 2   fluticasone furoate-vilanterol (BREO ELLIPTA) 200-25 MCG/ACT AEPB Inhale 1 puff into the lungs daily. 60 each 11   losartan (COZAAR) 25 MG tablet Take 1 tablet (25 mg total) by mouth daily. 30 tablet 0   No current facility-administered medications for this visit.    Review of Systems  Constitutional:  Positive for fatigue and unexpected weight change. Negative for appetite change, chills and fever.  HENT:   Negative for hearing loss and voice change.   Eyes:  Negative for eye problems.  Respiratory:  Positive for cough and shortness  of breath. Negative for chest tightness and hemoptysis.   Cardiovascular:  Negative for chest pain.  Gastrointestinal:  Negative for abdominal distention, abdominal pain and blood in stool.  Endocrine: Negative for hot flashes.  Genitourinary:  Negative for difficulty urinating and frequency.   Musculoskeletal:  Negative for arthralgias.  Skin:  Negative for itching and rash.  Neurological:  Negative for extremity weakness.  Hematological:  Negative for adenopathy.  Psychiatric/Behavioral:  Negative for confusion.    PHYSICAL EXAMINATION:  Vitals:   12/09/23  1411 12/09/23 1417  BP: (!) 160/94 (!) 157/90  Pulse: 79   Resp: 18   Temp: (!) 97.2 F (36.2 C)    Filed Weights   12/09/23 1411  Weight: 152 lb 4.8 oz (69.1 kg)    Physical Exam Constitutional:      General: He is not in acute distress. HENT:     Head: Normocephalic and atraumatic.  Eyes:     General: No scleral icterus. Cardiovascular:     Rate and Rhythm: Normal rate and regular rhythm.  Pulmonary:     Effort: Pulmonary effort is normal. No respiratory distress.     Breath sounds: No wheezing.     Comments: Decreased breath sound bilaterally Abdominal:     General: Bowel sounds are normal. There is no distension.     Palpations: Abdomen is soft.  Musculoskeletal:        General: No deformity. Normal range of motion.     Cervical back: Normal range of motion and neck supple.  Skin:    General: Skin is warm and dry.     Findings: No erythema or rash.  Neurological:     Mental Status: He is alert and oriented to person, place, and time. Mental status is at baseline.  Psychiatric:        Mood and Affect: Mood normal.     LABORATORY DATA:  I have reviewed the data as listed    Latest Ref Rng & Units 12/09/2023    2:42 PM 11/13/2023    5:12 AM 11/12/2023    4:12 AM  CBC  WBC 4.0 - 10.5 K/uL 8.9  12.9  12.9   Hemoglobin 13.0 - 17.0 g/dL 82.7  82.7  82.8   Hematocrit 39.0 - 52.0 % 53.4  53.8  54.6    Platelets 150 - 400 K/uL 234  242  248       Latest Ref Rng & Units 12/09/2023    2:42 PM 11/13/2023    5:12 AM 11/12/2023    4:12 AM  CMP  Glucose 70 - 99 mg/dL  92  92   BUN 6 - 20 mg/dL  18  16   Creatinine 9.38 - 1.24 mg/dL  9.30  9.29   Sodium 864 - 145 mmol/L  140  142   Potassium 3.5 - 5.1 mmol/L  3.5  4.0   Chloride 98 - 111 mmol/L  96  97   CO2 22 - 32 mmol/L  35  33   Calcium 8.9 - 10.3 mg/dL  8.5  8.7   Total Protein 6.5 - 8.1 g/dL 7.2  6.2  6.0   Total Bilirubin 0.0 - 1.2 mg/dL 0.8  1.6  1.6   Alkaline Phos 38 - 126 U/L 62  55  55   AST 15 - 41 U/L 15  19  21    ALT 0 - 44 U/L 12  20  19        RADIOGRAPHIC STUDIES: I have personally reviewed the radiological images as listed and agreed with the findings in the report. DG Chest 2 View Result Date: 11/09/2023 CLINICAL DATA:  Shortness of breath. EXAM: CHEST - 2 VIEW COMPARISON:  None Available. FINDINGS: The heart size and mediastinal contours are within normal limits. Mild atelectatic changes are suspected within the bilateral lung bases. No acute infiltrate, pleural effusion or pneumothorax is identified. The visualized skeletal structures are unremarkable. IMPRESSION: No active cardiopulmonary disease. Electronically Signed   By: Suzen Dwane HERO.D.  On: 11/09/2023 10:32

## 2023-12-09 NOTE — Assessment & Plan Note (Signed)
 Recommend smoke cessation.  Recommend lung cancer screening CT. He has upcoming appointment with pulmonology and prefers to defer imaging now.

## 2023-12-09 NOTE — Assessment & Plan Note (Signed)
 Labs are reviewed and discussed with patient.  Erythrocytosis is an abnormal elevation of hemoglobin and/or hematocrit in peripheral blood, and this can be caused by primary etiology, ie myeloproliferative disease, or secondary etiology, ie sleep apnea, smoking, testosterone etc  or familiar condition.   Clinically, most likely his erythrocytosis secondary to smoking/hypoxia from underlying chronic pulmonary disorders. I will check erythropoietin, carbo monoxide level, JAK2 with reflex to other mutations, BCR-ABL1 FISH.

## 2023-12-10 LAB — CARBON MONOXIDE, BLOOD (PERFORMED AT REF LAB): Carbon Monoxide, Blood: 15.2 % — ABNORMAL HIGH (ref 0.0–3.6)

## 2023-12-10 LAB — ERYTHROPOIETIN: Erythropoietin: 9.8 m[IU]/mL (ref 2.6–18.5)

## 2023-12-14 LAB — BCR-ABL1 FISH
Cells Analyzed: 200
Cells Counted: 200

## 2023-12-17 LAB — JAK2 V617F RFX CALR/MPL/E12-15

## 2023-12-17 LAB — CALR +MPL + E12-E15  (REFLEX)

## 2023-12-18 ENCOUNTER — Ambulatory Visit: Payer: Self-pay | Admitting: Oncology

## 2023-12-21 ENCOUNTER — Other Ambulatory Visit: Payer: Self-pay

## 2023-12-21 DIAGNOSIS — D751 Secondary polycythemia: Secondary | ICD-10-CM

## 2023-12-21 NOTE — Telephone Encounter (Signed)
 Called patient and informed him that his blood work results indicate that elevated blood counts are secondary to smoking and there is no need for urgent blood removal. I informed him that Dr. Babara would like to follow-up in 4 months with labs. I will have Rosina schedule and notify patient of appointment date and time.

## 2023-12-21 NOTE — Telephone Encounter (Signed)
-----   Message from Zelphia Cap sent at 12/18/2023 10:42 PM EST ----- Please let patient know that his blood work results indicate that elevated blood count is secondary to smoking.  No need for urgent blood removal. I recommend patient to follow-up in 4 months lab MD CBC.  Thank you ----- Message ----- From: Rebecka, Lab In [place] Sent: 12/09/2023   2:55 PM EST To: Zelphia Cap, MD

## 2023-12-29 ENCOUNTER — Ambulatory Visit: Admitting: Pulmonary Disease

## 2023-12-29 ENCOUNTER — Encounter: Payer: Self-pay | Admitting: Pulmonary Disease

## 2023-12-29 VITALS — BP 142/82 | HR 80 | Ht 69.0 in | Wt 151.0 lb

## 2023-12-29 DIAGNOSIS — J9611 Chronic respiratory failure with hypoxia: Secondary | ICD-10-CM | POA: Diagnosis not present

## 2023-12-29 DIAGNOSIS — J449 Chronic obstructive pulmonary disease, unspecified: Secondary | ICD-10-CM

## 2023-12-29 DIAGNOSIS — D751 Secondary polycythemia: Secondary | ICD-10-CM | POA: Diagnosis not present

## 2023-12-29 DIAGNOSIS — F17210 Nicotine dependence, cigarettes, uncomplicated: Secondary | ICD-10-CM | POA: Diagnosis not present

## 2023-12-29 MED ORDER — TRELEGY ELLIPTA 100-62.5-25 MCG/ACT IN AEPB: INHALATION_SPRAY | RESPIRATORY_TRACT | Status: AC

## 2023-12-29 NOTE — Progress Notes (Unsigned)
 New Patient Pulmonology Office Visit   Subjective:  Patient ID: Zachary Joseph, male    DOB: 01-14-1966  MRN: [id number]  Referred by: Leesa Kast, DO  CC:  Chief Complaint  Patient presents with   Consult    Pt states red blood cell high / breathing issues / smoker 1pak /      Discussed the use of AI scribe software for clinical note transcription with the patient, who gave verbal consent to proceed.  History of Present Illness Zachary Joseph is a 58 year old male with COPD who presents for evaluation of COPD. He is accompanied by his cousin.   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 He uses Breo Ellipta  200 microgram one puff daily but is almost out, and uses albuterol  as needed for dyspnea.  He has erythrocytosis with hemoglobin currently 17.2 g/dL and up to 81.1 this year. Recent labs from October showed serum bicarbonate 33 to 35.  He was recently hospitalized and discharged on home oxygen. He uses oxygen at 2 liters at night and after work but does not use his portable tank while working.  He has smoked since age 28 and currently smokes one pack per day. He previously quit for a couple of years and wants to quit again.  He denies nocturnal gasping, dyspnea with dressing or bathing, and leg swelling.  His mother has COPD and smokes. 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        Review of Systems  Constitutional:  Negative for chills, fever, malaise/fatigue and weight loss.  HENT:  Negative for congestion, sinus pain and sore throat.   Eyes: Negative.   Respiratory:  Positive for cough, sputum production, shortness of breath and wheezing. Negative for hemoptysis.   Cardiovascular:  Negative for chest pain, palpitations, orthopnea, claudication and leg swelling.  Gastrointestinal:   Negative for abdominal pain, heartburn, nausea and vomiting.  Genitourinary: Negative.   Musculoskeletal:  Negative for joint pain and myalgias.  Skin:  Negative for rash.  Neurological:  Negative for weakness.  Endo/Heme/Allergies: Negative.   Psychiatric/Behavioral: Negative.      Allergies: Patient has no known allergies.  Current Outpatient Medications:    albuterol  (VENTOLIN  HFA) 108 (90 Base) MCG/ACT inhaler, Inhale 2 puffs into the lungs every 6 (six) hours as needed for wheezing or shortness of breath., Disp: 6.7 g, Rfl: 2   fluticasone  furoate-vilanterol (BREO ELLIPTA ) 200-25 MCG/ACT AEPB, Inhale 1 puff into the lungs daily., Disp: 60 each, Rfl: 11   Fluticasone -Umeclidin-Vilant (TRELEGY ELLIPTA) 100-62.5-25 MCG/ACT AEPB, 2 samples, Disp: , Rfl:    losartan  (COZAAR ) 25 MG tablet, Take 1 tablet (25 mg total) by mouth daily., Disp: 30 tablet, Rfl: 0 No past medical history on file. Past Surgical History:  Procedure Laterality Date   HERNIA REPAIR     Family History  Problem Relation Age of Onset   COPD Mother    Breast cancer Maternal Aunt    Cancer Maternal Grandfather    Social History   Socioeconomic History   Marital status: Married    Spouse name: Not on file   Number of children: Not on file   Years of education: Not on file   Highest education level: Not on file  Occupational History   Not on file  Tobacco Use   Smoking status:  Every Day    Current packs/day: 0.50    Types: Cigarettes   Smokeless tobacco: Never  Vaping Use   Vaping status: Never Used  Substance and Sexual Activity   Alcohol use: Yes    Comment: occasional   Drug use: Not Currently   Sexual activity: Not on file  Other Topics Concern   Not on file  Social History Narrative   Not on file   Social Drivers of Health   Financial Resource Strain: Not on file  Food Insecurity: No Food Insecurity (12/09/2023)   Hunger Vital Sign    Worried About Running Out of Food in the Last Year:  Never true    Ran Out of Food in the Last Year: Never true  Transportation Needs: No Transportation Needs (12/09/2023)   PRAPARE - Administrator, Civil Service (Medical): No    Lack of Transportation (Non-Medical): No  Physical Activity: Not on file  Stress: Not on file  Social Connections: Socially Isolated (11/10/2023)   Social Connection and Isolation Panel    Frequency of Communication with Friends and Family: More than three times a week    Frequency of Social Gatherings with Friends and Family: More than three times a week    Attends Religious Services: Never    Database Administrator or Organizations: No    Attends Banker Meetings: Never    Marital Status: Divorced  Catering Manager Violence: Not At Risk (12/09/2023)   Humiliation, Afraid, Rape, and Kick questionnaire    Fear of Current or Ex-Partner: No    Emotionally Abused: No    Physically Abused: No    Sexually Abused: No       Objective:  BP (!) 142/82 (BP Location: Left Arm, Cuff Size: Normal)   Pulse 80   Ht 5' 9 (1.753 m) Comment: per pt  Wt 151 lb (68.5 kg)   SpO2 96% Comment: placed on 3 L POC  BMI 22.30 kg/m    Physical Exam Constitutional:      General: He is not in acute distress.    Appearance: Normal appearance.  Eyes:     General: No scleral icterus.    Conjunctiva/sclera: Conjunctivae normal.  Cardiovascular:     Rate and Rhythm: Normal rate and regular rhythm.  Pulmonary:     Breath sounds: No wheezing, rhonchi or rales.  Musculoskeletal:     Right lower leg: No edema.     Left lower leg: No edema.  Skin:    General: Skin is warm and dry.  Neurological:     General: No focal deficit present.      Office Visit from 12/29/2023 in [facility]   12/29/2023    1059  Resting   Supplemental oxygen during test? --  Resting Heart Rate 79  Resting Sp02 93  Lap 1 (250 feet)   HR 87  02 Sat 91  Lap 2 (250 feet)   HR 90   02 Sat 86  Lap 3 (250 feet)   HR --  02 Sat --  Tech Comments: Patient was not able to walk all three laps sats dropped to 86% placed on 3/4 L of POC O2   Diagnostic Review:  Last CBC Lab Results  Component Value Date   WBC 8.9 12/09/2023   HGB 17.2 (H) 12/09/2023   HCT 53.4 (H) 12/09/2023   MCV 94.0 12/09/2023   MCH 30.3 12/09/2023   RDW 12.1 12/09/2023  PLT 234 12/09/2023   Last metabolic panel Lab Results  Component Value Date   GLUCOSE 92 11/13/2023   NA 140 11/13/2023   K 3.5 11/13/2023   CL 96 (L) 11/13/2023   CO2 35 (H) 11/13/2023   BUN 18 11/13/2023   CREATININE 0.69 11/13/2023   GFRNONAA >60 11/13/2023   CALCIUM 8.5 (L) 11/13/2023   PHOS 4.1 11/13/2023   PROT 7.2 12/09/2023   ALBUMIN 3.7 12/09/2023   BILITOT 0.8 12/09/2023   ALKPHOS 62 12/09/2023   AST 15 12/09/2023   ALT 12 12/09/2023   ANIONGAP 9 11/13/2023       Assessment & Plan:   Assessment & Plan Chronic obstructive pulmonary disease, unspecified COPD type (HCC)  Orders:   Pulmonary Function Test; Future   Ambulatory Referral for DME  Chronic hypoxemic respiratory failure (HCC)  Orders:   Pulse oximetry, overnight; Future   Ambulatory Referral for DME   Assessment and Plan Assessment & Plan Chronic respiratory failure with hypoxia Chronic respiratory failure with hypoxia, likely secondary to COPD and smoking history.  - Conducted walk test to assess oxygen desaturation during activity. He desaturated below 88% and titarted to 3-4L on POC. Vitals as above. - Ordered portable oxygen concentrator - Ordered overnight oximetry test to evaluate nocturnal oxygen levels on 2L. - Continue nocturnal oxygen therapy at 2 liters.  Suspected chronic obstructive pulmonary disease (COPD) Suspected COPD based on smoking history and symptoms. Current use of Breo Ellipta  inhaler with reported benefit. Potential benefit from Trelegy inhaler due to additional medication component. - Scheduled  pulmonary function test for COPD diagnosis. - Provided Trelegy inhaler samples for trial use. - Will evaluate response to Trelegy inhaler and consider prescription if beneficial.  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 Elevated hemoglobin levels noted. - Address underlying hypoxia through oxygen therapy and smoking cessation.  Tobacco use disorder Long-standing tobacco use disorder with interest in quitting. Smoking contributes to respiratory issues and erythrocytosis. Discussed pharmacotherapy options including Chantix and Wellbutrin, with potential side effects and benefits. Discussed for 3 minutes. - Discussed smoking cessation options including Chantix and Wellbutrin. - Provided information on nicotine replacement therapy (patches and lozenges). - Encouraged consideration of smoking cessation resources and support. - Referred to lung cancer screening program for annual CT scan due to smoking history and age.      Return in about 3 months (around 03/28/2024) for f/u visit Dr. Kara.   Dorn KATHEE Kara, MD

## 2023-12-29 NOTE — Patient Instructions (Addendum)
 Schedule pulmonary function tests at the front desk   We will schedule you for overnight oxygen test on 2L of oxygen  Call 1-800-quit-NOW to get free nicotine replacement and counseling from the state of [place]    Recommend using nicotine patches 14-21mg  daily  Use mini nicotine lozenges 2mg  as needed for break through cravings  We will refer you to our lung cancer screening program to be scheduled for a CT Chest scan  Try trelegy ellipta 1 puff daily - rinse mouth out after each use  Stop breo while using the trelegy. If you like the trelegy, then please let us  know and we will send in prescription  Follow up in 3 months to review CT scan and breathing tests

## 2023-12-30 ENCOUNTER — Telehealth: Payer: Self-pay | Admitting: Pulmonary Disease

## 2023-12-30 ENCOUNTER — Encounter: Payer: Self-pay | Admitting: Pulmonary Disease

## 2023-12-30 NOTE — Telephone Encounter (Signed)
 Per Dolanda at [facility]-0/17/2025 by [place] is this referral suppose to be for the ono because I see a referral for an ono on O2. Please advise

## 2023-12-30 NOTE — Telephone Encounter (Signed)
 Yes, ONO is to be done on 2L as ordered to ensure he is using the right amount of oxygen at night.  Zachary Joseph

## 2023-12-30 NOTE — Telephone Encounter (Signed)
 Thank you I have sent this info to [facility]. NFN

## 2024-03-07 ENCOUNTER — Encounter

## 2024-03-07 ENCOUNTER — Ambulatory Visit: Admitting: Pulmonary Disease

## 2024-04-13 ENCOUNTER — Inpatient Hospital Stay

## 2024-04-13 ENCOUNTER — Inpatient Hospital Stay: Admitting: Oncology
